# Patient Record
Sex: Female | Born: 1935 | Race: White | Hispanic: No | Smoking: Never smoker
Health system: Southern US, Community
[De-identification: ages and names within clinical notes are randomized; demographics above are authoritative.]

## PROBLEM LIST (undated history)

## (undated) DIAGNOSIS — R42 Dizziness and giddiness: Secondary | ICD-10-CM

## (undated) DIAGNOSIS — M810 Age-related osteoporosis without current pathological fracture: Secondary | ICD-10-CM

## (undated) DIAGNOSIS — K219 Gastro-esophageal reflux disease without esophagitis: Secondary | ICD-10-CM

## (undated) DIAGNOSIS — K5792 Diverticulitis of intestine, part unspecified, without perforation or abscess without bleeding: Secondary | ICD-10-CM

## (undated) DIAGNOSIS — M069 Rheumatoid arthritis, unspecified: Secondary | ICD-10-CM

## (undated) HISTORY — PX: APPENDECTOMY: SHX54

---

## 2015-05-05 ENCOUNTER — Encounter (HOSPITAL_COMMUNITY): Payer: Self-pay | Admitting: *Deleted

## 2015-05-05 ENCOUNTER — Emergency Department (HOSPITAL_COMMUNITY): Payer: Medicare Other

## 2015-05-05 ENCOUNTER — Inpatient Hospital Stay (HOSPITAL_COMMUNITY): Payer: Medicare Other

## 2015-05-05 ENCOUNTER — Inpatient Hospital Stay (HOSPITAL_COMMUNITY): Payer: Medicare Other | Admitting: Anesthesiology

## 2015-05-05 ENCOUNTER — Inpatient Hospital Stay (HOSPITAL_COMMUNITY)
Admission: EM | Admit: 2015-05-05 | Discharge: 2015-05-08 | DRG: 470 | Disposition: A | Discharge status: Skilled Nursing Facility | Payer: Medicare Other | Attending: Internal Medicine | Admitting: Internal Medicine

## 2015-05-05 ENCOUNTER — Encounter (HOSPITAL_COMMUNITY)
Admission: EM | Disposition: A | Discharge status: Skilled Nursing Facility | Payer: Self-pay | Source: Home / Self Care | Attending: Internal Medicine

## 2015-05-05 DIAGNOSIS — D72829 Elevated white blood cell count, unspecified: Secondary | ICD-10-CM | POA: Diagnosis present

## 2015-05-05 DIAGNOSIS — M069 Rheumatoid arthritis, unspecified: Secondary | ICD-10-CM | POA: Diagnosis present

## 2015-05-05 DIAGNOSIS — R509 Fever, unspecified: Secondary | ICD-10-CM | POA: Diagnosis not present

## 2015-05-05 DIAGNOSIS — Z7982 Long term (current) use of aspirin: Secondary | ICD-10-CM | POA: Diagnosis not present

## 2015-05-05 DIAGNOSIS — Y92240 Courthouse as the place of occurrence of the external cause: Secondary | ICD-10-CM

## 2015-05-05 DIAGNOSIS — M25551 Pain in right hip: Secondary | ICD-10-CM | POA: Diagnosis present

## 2015-05-05 DIAGNOSIS — W108XXA Fall (on) (from) other stairs and steps, initial encounter: Secondary | ICD-10-CM | POA: Diagnosis present

## 2015-05-05 DIAGNOSIS — W19XXXA Unspecified fall, initial encounter: Secondary | ICD-10-CM | POA: Insufficient documentation

## 2015-05-05 DIAGNOSIS — K219 Gastro-esophageal reflux disease without esophagitis: Secondary | ICD-10-CM | POA: Diagnosis present

## 2015-05-05 DIAGNOSIS — S0181XA Laceration without foreign body of other part of head, initial encounter: Secondary | ICD-10-CM | POA: Diagnosis not present

## 2015-05-05 DIAGNOSIS — M810 Age-related osteoporosis without current pathological fracture: Secondary | ICD-10-CM | POA: Diagnosis present

## 2015-05-05 DIAGNOSIS — D62 Acute posthemorrhagic anemia: Secondary | ICD-10-CM | POA: Diagnosis not present

## 2015-05-05 DIAGNOSIS — Z01811 Encounter for preprocedural respiratory examination: Secondary | ICD-10-CM

## 2015-05-05 DIAGNOSIS — S52134A Nondisplaced fracture of neck of right radius, initial encounter for closed fracture: Secondary | ICD-10-CM | POA: Diagnosis not present

## 2015-05-05 DIAGNOSIS — S72009A Fracture of unspecified part of neck of unspecified femur, initial encounter for closed fracture: Secondary | ICD-10-CM | POA: Diagnosis present

## 2015-05-05 DIAGNOSIS — D696 Thrombocytopenia, unspecified: Secondary | ICD-10-CM | POA: Diagnosis present

## 2015-05-05 DIAGNOSIS — I959 Hypotension, unspecified: Secondary | ICD-10-CM | POA: Diagnosis not present

## 2015-05-05 DIAGNOSIS — S5011XA Contusion of right forearm, initial encounter: Secondary | ICD-10-CM

## 2015-05-05 DIAGNOSIS — S5010XA Contusion of unspecified forearm, initial encounter: Secondary | ICD-10-CM | POA: Insufficient documentation

## 2015-05-05 DIAGNOSIS — S72001A Fracture of unspecified part of neck of right femur, initial encounter for closed fracture: Principal | ICD-10-CM | POA: Diagnosis present

## 2015-05-05 DIAGNOSIS — Z96641 Presence of right artificial hip joint: Secondary | ICD-10-CM

## 2015-05-05 DIAGNOSIS — T148XXA Other injury of unspecified body region, initial encounter: Secondary | ICD-10-CM

## 2015-05-05 DIAGNOSIS — I9581 Postprocedural hypotension: Secondary | ICD-10-CM | POA: Diagnosis not present

## 2015-05-05 DIAGNOSIS — I950 Idiopathic hypotension: Secondary | ICD-10-CM | POA: Diagnosis not present

## 2015-05-05 HISTORY — DX: Rheumatoid arthritis, unspecified: M06.9

## 2015-05-05 HISTORY — DX: Diverticulitis of intestine, part unspecified, without perforation or abscess without bleeding: K57.92

## 2015-05-05 HISTORY — DX: Gastro-esophageal reflux disease without esophagitis: K21.9

## 2015-05-05 HISTORY — DX: Dizziness and giddiness: R42

## 2015-05-05 HISTORY — DX: Age-related osteoporosis without current pathological fracture: M81.0

## 2015-05-05 LAB — URINALYSIS, ROUTINE W REFLEX MICROSCOPIC
Bilirubin Urine: NEGATIVE
GLUCOSE, UA: NEGATIVE mg/dL
HGB URINE DIPSTICK: NEGATIVE
Ketones, ur: NEGATIVE mg/dL
Nitrite: NEGATIVE
Protein, ur: NEGATIVE mg/dL
SPECIFIC GRAVITY, URINE: 1.008 (ref 1.005–1.030)
UROBILINOGEN UA: 0.2 mg/dL (ref 0.0–1.0)
pH: 7 (ref 5.0–8.0)

## 2015-05-05 LAB — CBC WITH DIFFERENTIAL/PLATELET
BASOS ABS: 0 10*3/uL (ref 0.0–0.1)
BASOS PCT: 1 %
EOS PCT: 1 %
Eosinophils Absolute: 0.1 10*3/uL (ref 0.0–0.7)
HCT: 48.2 % — ABNORMAL HIGH (ref 36.0–46.0)
Hemoglobin: 15.9 g/dL — ABNORMAL HIGH (ref 12.0–15.0)
Lymphocytes Relative: 12 %
Lymphs Abs: 1 10*3/uL (ref 0.7–4.0)
MCH: 31.2 pg (ref 26.0–34.0)
MCHC: 33 g/dL (ref 30.0–36.0)
MCV: 94.7 fL (ref 78.0–100.0)
MONO ABS: 0.7 10*3/uL (ref 0.1–1.0)
MONOS PCT: 8 %
Neutro Abs: 6.5 10*3/uL (ref 1.7–7.7)
Neutrophils Relative %: 78 %
PLATELETS: 258 10*3/uL (ref 150–400)
RBC: 5.09 MIL/uL (ref 3.87–5.11)
RDW: 13 % (ref 11.5–15.5)
WBC: 8.3 10*3/uL (ref 4.0–10.5)

## 2015-05-05 LAB — URINE MICROSCOPIC-ADD ON

## 2015-05-05 LAB — BASIC METABOLIC PANEL
Anion gap: 9 (ref 5–15)
BUN: 19 mg/dL (ref 6–20)
CALCIUM: 9.7 mg/dL (ref 8.9–10.3)
CO2: 27 mmol/L (ref 22–32)
CREATININE: 0.88 mg/dL (ref 0.44–1.00)
Chloride: 103 mmol/L (ref 101–111)
GFR calc Af Amer: >60 mL/min (ref >60)
GFR, EST NON AFRICAN AMERICAN: 60 mL/min — AB (ref >60)
GLUCOSE: 101 mg/dL — AB (ref 65–99)
Potassium: 4.6 mmol/L (ref 3.5–5.1)
Sodium: 139 mmol/L (ref 135–145)

## 2015-05-05 LAB — SURGICAL PCR SCREEN
MRSA, PCR: NEGATIVE
STAPHYLOCOCCUS AUREUS: NEGATIVE

## 2015-05-05 LAB — CBG MONITORING, ED: Glucose-Capillary: 111 mg/dL — ABNORMAL HIGH (ref 65–99)

## 2015-05-05 SURGERY — HEMIARTHROPLASTY, HIP, DIRECT ANTERIOR APPROACH, FOR FRACTURE
Anesthesia: General | Site: Hip | Laterality: Right

## 2015-05-05 MED ORDER — SODIUM CHLORIDE 0.9 % IV BOLUS (SEPSIS)
500.0000 mL | Freq: Once | INTRAVENOUS | Status: AC
  Administered 2015-05-05: 500 mL via INTRAVENOUS

## 2015-05-05 MED ORDER — METHOCARBAMOL 500 MG PO TABS
500.0000 mg | ORAL_TABLET | Freq: Four times a day (QID) | ORAL | Status: DC | PRN
  Administered 2015-05-07 – 2015-05-08 (×3): 500 mg via ORAL
  Filled 2015-05-05 (×3): qty 1

## 2015-05-05 MED ORDER — HYDROMORPHONE HCL 1 MG/ML IJ SOLN
INTRAMUSCULAR | Status: AC
  Filled 2015-05-05: qty 1

## 2015-05-05 MED ORDER — MORPHINE SULFATE (PF) 4 MG/ML IV SOLN
4.0000 mg | Freq: Once | INTRAVENOUS | Status: AC
  Administered 2015-05-05: 4 mg via INTRAVENOUS
  Filled 2015-05-05: qty 1

## 2015-05-05 MED ORDER — CEFAZOLIN SODIUM-DEXTROSE 2-3 GM-% IV SOLR
INTRAVENOUS | Status: AC
  Filled 2015-05-05: qty 50

## 2015-05-05 MED ORDER — PHENYLEPHRINE HCL 10 MG/ML IJ SOLN
10.0000 mg | INTRAVENOUS | Status: DC | PRN
  Administered 2015-05-05: 40 ug/min via INTRAVENOUS

## 2015-05-05 MED ORDER — FENTANYL CITRATE (PF) 250 MCG/5ML IJ SOLN
INTRAMUSCULAR | Status: AC
  Filled 2015-05-05: qty 25

## 2015-05-05 MED ORDER — ENOXAPARIN SODIUM 40 MG/0.4ML ~~LOC~~ SOLN
40.0000 mg | SUBCUTANEOUS | Status: DC
  Administered 2015-05-06: 40 mg via SUBCUTANEOUS
  Filled 2015-05-05 (×3): qty 0.4

## 2015-05-05 MED ORDER — PROPOFOL 10 MG/ML IV BOLUS
INTRAVENOUS | Status: DC | PRN
  Administered 2015-05-05: 80 mg via INTRAVENOUS

## 2015-05-05 MED ORDER — PROMETHAZINE HCL 25 MG/ML IJ SOLN: 6.2500 mg | INTRAMUSCULAR | Status: DC | PRN

## 2015-05-05 MED ORDER — DEXAMETHASONE SODIUM PHOSPHATE 10 MG/ML IJ SOLN
INTRAMUSCULAR | Status: AC
  Filled 2015-05-05: qty 1

## 2015-05-05 MED ORDER — HYDROMORPHONE HCL 1 MG/ML IJ SOLN
0.2500 mg | INTRAMUSCULAR | Status: DC | PRN
  Administered 2015-05-05 (×2): 0.25 mg via INTRAVENOUS

## 2015-05-05 MED ORDER — MENTHOL 3 MG MT LOZG: 1.0000 | LOZENGE | OROMUCOSAL | Status: DC | PRN

## 2015-05-05 MED ORDER — LACTATED RINGERS IV SOLN
INTRAVENOUS | Status: DC | PRN
Start: 2015-05-05 — End: 2015-05-05
  Administered 2015-05-05: 20:00:00 via INTRAVENOUS

## 2015-05-05 MED ORDER — METOCLOPRAMIDE HCL 10 MG PO TABS
5.0000 mg | ORAL_TABLET | Freq: Three times a day (TID) | ORAL | Status: DC | PRN
  Administered 2015-05-06: 10 mg via ORAL
  Filled 2015-05-05: qty 1

## 2015-05-05 MED ORDER — DEXTROSE 5 % IV SOLN
500.0000 mg | Freq: Four times a day (QID) | INTRAVENOUS | Status: DC | PRN
  Administered 2015-05-05 – 2015-05-06 (×2): 500 mg via INTRAVENOUS
  Filled 2015-05-05 (×6): qty 5

## 2015-05-05 MED ORDER — LIDOCAINE-EPINEPHRINE-TETRACAINE (LET) SOLUTION
3.0000 mL | Freq: Once | NASAL | Status: AC
  Administered 2015-05-05: 3 mL via TOPICAL
  Filled 2015-05-05: qty 3

## 2015-05-05 MED ORDER — ONDANSETRON HCL 4 MG/2ML IJ SOLN
INTRAMUSCULAR | Status: DC | PRN
  Administered 2015-05-05 (×2): 2 mg via INTRAVENOUS

## 2015-05-05 MED ORDER — ONDANSETRON HCL 4 MG/2ML IJ SOLN: 4.0000 mg | Freq: Four times a day (QID) | INTRAMUSCULAR | Status: DC | PRN

## 2015-05-05 MED ORDER — OXYCODONE HCL 5 MG PO TABS: 5.0000 mg | ORAL_TABLET | ORAL | Status: DC | PRN

## 2015-05-05 MED ORDER — MORPHINE SULFATE (PF) 2 MG/ML IV SOLN
0.5000 mg | INTRAVENOUS | Status: DC | PRN
  Administered 2015-05-05: 0.5 mg via INTRAVENOUS
  Filled 2015-05-05: qty 1

## 2015-05-05 MED ORDER — LIDOCAINE HCL (CARDIAC) 20 MG/ML IV SOLN
INTRAVENOUS | Status: DC | PRN
  Administered 2015-05-05: 50 mg via INTRAVENOUS

## 2015-05-05 MED ORDER — CEFAZOLIN SODIUM-DEXTROSE 2-3 GM-% IV SOLR
2.0000 g | Freq: Four times a day (QID) | INTRAVENOUS | Status: AC
  Administered 2015-05-06 (×2): 2 g via INTRAVENOUS
  Filled 2015-05-05 (×2): qty 50

## 2015-05-05 MED ORDER — METOCLOPRAMIDE HCL 5 MG/ML IJ SOLN
5.0000 mg | Freq: Three times a day (TID) | INTRAMUSCULAR | Status: DC | PRN
  Administered 2015-05-05: 5 mg via INTRAVENOUS
  Filled 2015-05-05: qty 2

## 2015-05-05 MED ORDER — ONDANSETRON HCL 4 MG PO TABS: 4.0000 mg | ORAL_TABLET | Freq: Four times a day (QID) | ORAL | Status: DC | PRN

## 2015-05-05 MED ORDER — PROPOFOL 10 MG/ML IV BOLUS
INTRAVENOUS | Status: AC
  Filled 2015-05-05: qty 20

## 2015-05-05 MED ORDER — ASPIRIN EC 325 MG PO TBEC
325.0000 mg | DELAYED_RELEASE_TABLET | Freq: Two times a day (BID) | ORAL | Status: DC
  Administered 2015-05-06 – 2015-05-08 (×5): 325 mg via ORAL
  Filled 2015-05-05 (×7): qty 1

## 2015-05-05 MED ORDER — HYDROCODONE-ACETAMINOPHEN 5-325 MG PO TABS: 1.0000 | ORAL_TABLET | Freq: Four times a day (QID) | ORAL | Status: DC | PRN

## 2015-05-05 MED ORDER — LACTATED RINGERS IV SOLN: INTRAVENOUS | Status: DC

## 2015-05-05 MED ORDER — SUCCINYLCHOLINE CHLORIDE 20 MG/ML IJ SOLN
INTRAMUSCULAR | Status: DC | PRN
  Administered 2015-05-05: 80 mg via INTRAVENOUS

## 2015-05-05 MED ORDER — ONDANSETRON HCL 4 MG/2ML IJ SOLN
INTRAMUSCULAR | Status: AC
  Filled 2015-05-05: qty 2

## 2015-05-05 MED ORDER — ONDANSETRON HCL 4 MG/2ML IJ SOLN
4.0000 mg | Freq: Once | INTRAMUSCULAR | Status: AC
  Administered 2015-05-05: 4 mg via INTRAVENOUS
  Filled 2015-05-05: qty 2

## 2015-05-05 MED ORDER — LACTATED RINGERS IV SOLN
INTRAVENOUS | Status: DC | PRN
  Administered 2015-05-05 (×3): via INTRAVENOUS

## 2015-05-05 MED ORDER — ONDANSETRON HCL 4 MG/2ML IJ SOLN
4.0000 mg | Freq: Four times a day (QID) | INTRAMUSCULAR | Status: DC | PRN
  Administered 2015-05-06 (×2): 4 mg via INTRAVENOUS
  Filled 2015-05-05 (×2): qty 2

## 2015-05-05 MED ORDER — DEXAMETHASONE SODIUM PHOSPHATE 10 MG/ML IJ SOLN
INTRAMUSCULAR | Status: DC | PRN
  Administered 2015-05-05: 10 mg via INTRAVENOUS

## 2015-05-05 MED ORDER — PHENOL 1.4 % MT LIQD: 1.0000 | OROMUCOSAL | Status: DC | PRN

## 2015-05-05 MED ORDER — MORPHINE SULFATE (PF) 2 MG/ML IV SOLN: 0.5000 mg | INTRAVENOUS | Status: DC | PRN

## 2015-05-05 MED ORDER — ACETAMINOPHEN 650 MG RE SUPP: 650.0000 mg | Freq: Four times a day (QID) | RECTAL | Status: DC | PRN

## 2015-05-05 MED ORDER — PANTOPRAZOLE SODIUM 40 MG IV SOLR
40.0000 mg | INTRAVENOUS | Status: DC
  Administered 2015-05-06: 40 mg via INTRAVENOUS
  Filled 2015-05-05 (×4): qty 40

## 2015-05-05 MED ORDER — SODIUM CHLORIDE 0.9 % IR SOLN
Status: DC | PRN
  Administered 2015-05-05: 1000 mL

## 2015-05-05 MED ORDER — PANTOPRAZOLE SODIUM 40 MG IV SOLR
40.0000 mg | Freq: Once | INTRAVENOUS | Status: AC
  Administered 2015-05-05: 40 mg via INTRAVENOUS
  Filled 2015-05-05: qty 40

## 2015-05-05 MED ORDER — FENTANYL CITRATE (PF) 100 MCG/2ML IJ SOLN
INTRAMUSCULAR | Status: DC | PRN
  Administered 2015-05-05: 100 ug via INTRAVENOUS
  Administered 2015-05-05: 50 ug via INTRAVENOUS

## 2015-05-05 MED ORDER — DOCUSATE SODIUM 100 MG PO CAPS
100.0000 mg | ORAL_CAPSULE | Freq: Two times a day (BID) | ORAL | Status: DC
  Administered 2015-05-06 – 2015-05-08 (×5): 100 mg via ORAL

## 2015-05-05 MED ORDER — HYDROCODONE-ACETAMINOPHEN 5-325 MG PO TABS
1.0000 | ORAL_TABLET | Freq: Four times a day (QID) | ORAL | Status: DC | PRN
  Filled 2015-05-05: qty 1

## 2015-05-05 MED ORDER — SODIUM CHLORIDE 0.9 % IV SOLN
INTRAVENOUS | Status: DC
  Administered 2015-05-05 – 2015-05-06 (×3): via INTRAVENOUS

## 2015-05-05 MED ORDER — CEFAZOLIN SODIUM-DEXTROSE 2-3 GM-% IV SOLR
INTRAVENOUS | Status: DC | PRN
  Administered 2015-05-05: 2 g via INTRAVENOUS

## 2015-05-05 MED ORDER — ACETAMINOPHEN 325 MG PO TABS
650.0000 mg | ORAL_TABLET | Freq: Four times a day (QID) | ORAL | Status: DC | PRN
  Administered 2015-05-06 – 2015-05-08 (×4): 650 mg via ORAL
  Filled 2015-05-05 (×4): qty 2

## 2015-05-05 MED ORDER — PHENYLEPHRINE HCL 10 MG/ML IJ SOLN
INTRAMUSCULAR | Status: DC | PRN
  Administered 2015-05-05 (×2): 40 ug via INTRAVENOUS

## 2015-05-05 SURGICAL SUPPLY — 41 items
BAG ZIPLOCK 12X15 (MISCELLANEOUS) IMPLANT
BENZOIN TINCTURE PRP APPL 2/3 (GAUZE/BANDAGES/DRESSINGS) ×3 IMPLANT
BLADE SAW SGTL 18X1.27X75 (BLADE) ×2 IMPLANT
BLADE SAW SGTL 18X1.27X75MM (BLADE) ×1
CAPT HIP TOTAL 2 ×3 IMPLANT
CELLS DAT CNTRL 66122 CELL SVR (MISCELLANEOUS) ×1 IMPLANT
CLOSURE WOUND 1/2 X4 (GAUZE/BANDAGES/DRESSINGS) ×1
COVER PERINEAL POST (MISCELLANEOUS) ×3 IMPLANT
DRAPE C-ARM 42X120 X-RAY (DRAPES) ×3 IMPLANT
DRAPE STERI IOBAN 125X83 (DRAPES) ×3 IMPLANT
DRAPE U-SHAPE 47X51 STRL (DRAPES) ×9 IMPLANT
DRSG AQUACEL AG ADV 3.5X10 (GAUZE/BANDAGES/DRESSINGS) ×3 IMPLANT
DURAPREP 26ML APPLICATOR (WOUND CARE) ×3 IMPLANT
ELECT BLADE TIP CTD 4 INCH (ELECTRODE) ×3 IMPLANT
ELECT REM PT RETURN 9FT ADLT (ELECTROSURGICAL) ×3
ELECTRODE REM PT RTRN 9FT ADLT (ELECTROSURGICAL) ×1 IMPLANT
FACESHIELD WRAPAROUND (MASK) ×12 IMPLANT
GAUZE XEROFORM 1X8 LF (GAUZE/BANDAGES/DRESSINGS) IMPLANT
GLOVE BIO SURGEON STRL SZ7.5 (GLOVE) ×3 IMPLANT
GLOVE BIOGEL PI IND STRL 8 (GLOVE) ×2 IMPLANT
GLOVE BIOGEL PI INDICATOR 8 (GLOVE) ×4
GLOVE ECLIPSE 8.0 STRL XLNG CF (GLOVE) ×3 IMPLANT
GOWN STRL REUS W/TWL XL LVL3 (GOWN DISPOSABLE) ×6 IMPLANT
HANDPIECE INTERPULSE COAX TIP (DISPOSABLE) ×2
KIT BASIN OR (CUSTOM PROCEDURE TRAY) ×3 IMPLANT
PACK TOTAL JOINT (CUSTOM PROCEDURE TRAY) ×3 IMPLANT
PEN SKIN MARKING BROAD (MISCELLANEOUS) ×3 IMPLANT
RTRCTR WOUND ALEXIS 18CM MED (MISCELLANEOUS) ×3
SET HNDPC FAN SPRY TIP SCT (DISPOSABLE) ×1 IMPLANT
STAPLER VISISTAT 35W (STAPLE) IMPLANT
STRIP CLOSURE SKIN 1/2X4 (GAUZE/BANDAGES/DRESSINGS) ×2 IMPLANT
SUT ETHIBOND NAB CT1 #1 30IN (SUTURE) ×3 IMPLANT
SUT MNCRL AB 4-0 PS2 18 (SUTURE) IMPLANT
SUT VIC AB 0 CT1 36 (SUTURE) ×3 IMPLANT
SUT VIC AB 1 CT1 36 (SUTURE) ×3 IMPLANT
SUT VIC AB 2-0 CT1 27 (SUTURE) ×4
SUT VIC AB 2-0 CT1 TAPERPNT 27 (SUTURE) ×2 IMPLANT
TOWEL OR 17X26 10 PK STRL BLUE (TOWEL DISPOSABLE) ×3 IMPLANT
TOWEL OR NON WOVEN STRL DISP B (DISPOSABLE) ×3 IMPLANT
TRAY FOLEY W/METER SILVER 14FR (SET/KITS/TRAYS/PACK) ×3 IMPLANT
YANKAUER SUCT BULB TIP 10FT TU (MISCELLANEOUS) ×3 IMPLANT

## 2015-05-05 NOTE — ED Notes (Signed)
Pt back from xray, will apply LET

## 2015-05-05 NOTE — ED Notes (Signed)
Report given to Wylene Men, RN, ED RN unable to do bedside reporting at this time.

## 2015-05-05 NOTE — Anesthesia Preprocedure Evaluation (Addendum)
Anesthesia Evaluation  Patient identified by MRN, date of birth, ID band Patient awake    Reviewed: Allergy & Precautions, H&P , NPO status , Patient's Chart, lab work & pertinent test results  Airway Mallampati: II  TM Distance: >3 FB Neck ROM: full    Dental no notable dental hx. (+) Dental Advisory Given, Teeth Intact   Pulmonary neg pulmonary ROS,    Pulmonary exam normal breath sounds clear to auscultation       Cardiovascular Exercise Tolerance: Good negative cardio ROS Normal cardiovascular exam Rhythm:regular Rate:Normal     Neuro/Psych vertigo negative neurological ROS  negative psych ROS   GI/Hepatic negative GI ROS, Neg liver ROS,   Endo/Other  negative endocrine ROS  Renal/GU negative Renal ROS  negative genitourinary   Musculoskeletal  (+) Arthritis , Rheumatoid disorders,    Abdominal   Peds  Hematology negative hematology ROS (+)   Anesthesia Other Findings   Reproductive/Obstetrics negative OB ROS                            Anesthesia Physical Anesthesia Plan  ASA: III  Anesthesia Plan: General   Post-op Pain Management:    Induction: Intravenous  Airway Management Planned: Oral ETT  Additional Equipment:   Intra-op Plan:   Post-operative Plan: Extubation in OR  Informed Consent: I have reviewed the patients History and Physical, chart, labs and discussed the procedure including the risks, benefits and alternatives for the proposed anesthesia with the patient or authorized representative who has indicated his/her understanding and acceptance.   Dental Advisory Given  Plan Discussed with: CRNA and Surgeon  Anesthesia Plan Comments:         Anesthesia Quick Evaluation

## 2015-05-05 NOTE — Transfer of Care (Signed)
Immediate Anesthesia Transfer of Care Note  Patient: Stacey Hardin  Procedure(s) Performed: Procedure(s): RIGHT ANTERIOR APPROACH HIP ARTHROPLASTY (Right)  Patient Location: PACU  Anesthesia Type:General  Level of Consciousness: awake, pateint uncooperative, confused, lethargic and responds to stimulation  Airway & Oxygen Therapy: Patient Spontanous Breathing and Patient connected to face mask oxygen  Post-op Assessment: Report given to RN, Post -op Vital signs reviewed and stable and Patient moving all extremities  Post vital signs: Reviewed and stable  Last Vitals:  Filed Vitals:   05/05/15 1507  BP: 113/60  Pulse: 76  Temp: 36.2 C  Resp: 17    Complications: No apparent anesthesia complications

## 2015-05-05 NOTE — ED Notes (Signed)
Bed: DZ32 Expected date:  Expected time:  Means of arrival:  Comments: EMS- 81yo F, fall/elbow pain/leg pain/headache/no thinners

## 2015-05-05 NOTE — Anesthesia Procedure Notes (Signed)
Procedure Name: Intubation Date/Time: 05/05/2015 6:18 PM Performed by: Paris Lore Pre-anesthesia Checklist: Patient identified, Emergency Drugs available, Suction available, Patient being monitored and Timeout performed Patient Re-evaluated:Patient Re-evaluated prior to inductionOxygen Delivery Method: Circle system utilized Preoxygenation: Pre-oxygenation with 100% oxygen Intubation Type: IV induction Ventilation: Mask ventilation without difficulty Laryngoscope Size: Mac and 4 Grade View: Grade I Tube type: Oral Tube size: 7.5 mm Number of attempts: 1 Airway Equipment and Method: Stylet Placement Confirmation: ETT inserted through vocal cords under direct vision,  positive ETCO2,  CO2 detector and breath sounds checked- equal and bilateral Secured at: 19 cm Tube secured with: Tape Dental Injury: Teeth and Oropharynx as per pre-operative assessment

## 2015-05-05 NOTE — ED Notes (Signed)
Pt occasionally holds her breath and oxygen sat drops to 88%, 2 L [place] applied.

## 2015-05-05 NOTE — ED Notes (Signed)
Pt needs laceration closure before pt can be transferred upstairs.

## 2015-05-05 NOTE — ED Notes (Signed)
md at bedside  Pt alert and oriented x4. Respirations even and unlabored, bilateral symmetrical rise and fall of chest. Skin warm and dry. In no acute distress. Denies needs.   

## 2015-05-05 NOTE — Progress Notes (Signed)
Patient ID: Stacey Hardin, female   DOB: 06-21-1932, 81 y.o.   MRN: [id number] I was called to see Stacey Hardin as a consult to address her right hip femoral neck fracture.  I plan on proceeding to surgery sometime this evening - likely after 6 pm given the OR schedule and already fixing another hip this evening.  Should stay NPO for now since surgery will be later tonight.  Will see the patient this evening to discuss surgery in further detail and go over x-rays.  She will also be seen by the Medical Service for assessment and evaluation for surgical clearance.

## 2015-05-05 NOTE — Consult Note (Signed)
Reason for Consult:  Right hip fracture and right radial neck fracture Referring Physician: EDP  Stacey Hardin is an 81 y.o. female.  HPI: 81 yo female who [place] irregular surface.  Had significant right hip pain and right elbow pain and was brought to the [facility] ED where she was found to have a right hip fracture and a right radial neck fracture.  Ortho is consulted to address these injuries.  She reports significant right hip and right elbow pain.  Past Medical History  Diagnosis Date  . Osteoporosis   . Vertigo   . Diverticulitis   . Rheumatoid arthritis (HCC)   . GERD (gastroesophageal reflux disease)     Past Surgical History  Procedure Laterality Date  . Appendectomy      History reviewed. No pertinent family history.  Social History:  reports that she has never smoked. She does not have any smokeless tobacco history on file. She reports that she does not drink alcohol or use illicit drugs.  Allergies: No Known Allergies  Medications: I have reviewed the patient's current medications.  Results for orders placed or performed during the hospital encounter of 05/05/15 (from the past 48 hour(s))  Basic metabolic panel     Status: Abnormal   Collection Time: 05/05/15 10:19 AM  Result Value Ref Range   Sodium 139 135 - 145 mmol/L   Potassium 4.6 3.5 - 5.1 mmol/L   Chloride 103 101 - 111 mmol/L   CO2 27 22 - 32 mmol/L   Glucose, Bld 101 (H) 65 - 99 mg/dL   BUN 19 6 - 20 mg/dL   Creatinine, Ser 0.88 0.44 - 1.00 mg/dL   Calcium 9.7 8.9 - 10.3 mg/dL   GFR calc non Af Amer 60 (L) >60 mL/min   GFR calc Af Amer >60 >60 mL/min    Comment: (NOTE) The eGFR has been calculated using the CKD EPI equation. This calculation has not been validated in all clinical situations. eGFR's persistently <60 mL/min signify possible Chronic Kidney Disease.    Anion gap 9 5 - 15  CBC with Differential      Status: Abnormal   Collection Time: 05/05/15 10:19 AM  Result Value Ref Range   WBC 8.3 4.0 - 10.5 K/uL   RBC 5.09 3.87 - 5.11 MIL/uL   Hemoglobin 15.9 (H) 12.0 - 15.0 g/dL   HCT 48.2 (H) 36.0 - 46.0 %   MCV 94.7 78.0 - 100.0 fL   MCH 31.2 26.0 - 34.0 pg   MCHC 33.0 30.0 - 36.0 g/dL   RDW 13.0 11.5 - 15.5 %   Platelets 258 150 - 400 K/uL   Neutrophils Relative % 78 %   Neutro Abs 6.5 1.7 - 7.7 K/uL   Lymphocytes Relative 12 %   Lymphs Abs 1.0 0.7 - 4.0 K/uL   Monocytes Relative 8 %   Monocytes Absolute 0.7 0.1 - 1.0 K/uL   Eosinophils Relative 1 %   Eosinophils Absolute 0.1 0.0 - 0.7 K/uL   Basophils Relative 1 %   Basophils Absolute 0.0 0.0 - 0.1 K/uL  CBG monitoring, ED     Status: Abnormal   Collection Time: 05/05/15 12:28 PM  Result Value Ref Range   Glucose-Capillary 111 (H) 65 - 99 mg/dL  Urinalysis, Routine w reflex microscopic (not at [facility])     Status: Abnormal   Collection Time: 05/05/15  1:25 PM  Result Value   Ref Range   Color, Urine YELLOW YELLOW   APPearance CLEAR CLEAR   Specific Gravity, Urine 1.008 1.005 - 1.030   pH 7.0 5.0 - 8.0   Glucose, UA NEGATIVE NEGATIVE mg/dL   Hgb urine dipstick NEGATIVE NEGATIVE   Bilirubin Urine NEGATIVE NEGATIVE   Ketones, ur NEGATIVE NEGATIVE mg/dL   Protein, ur NEGATIVE NEGATIVE mg/dL   Urobilinogen, UA 0.2 0.0 - 1.0 mg/dL   Nitrite NEGATIVE NEGATIVE   Leukocytes, UA SMALL (A) NEGATIVE  Urine microscopic-add on     Status: None   Collection Time: 05/05/15  1:25 PM  Result Value Ref Range   Squamous Epithelial / LPF RARE RARE   WBC, UA 0-2 <3 WBC/hpf   RBC / HPF 0-2 <3 RBC/hpf    Dg Chest 1 View  05/05/2015  CLINICAL DATA:  Right hip fracture preop evaluation EXAM: CHEST 1 VIEW COMPARISON:  None. FINDINGS: Heart size is normal. Vascularity is normal. Negative for infiltrate or effusion. Mild apical pleural thickening bilaterally. Small calcified granuloma right lung base. No pleural effusion. IMPRESSION: No active  disease. Electronically Signed   By: Franchot Gallo M.D.   On: 05/05/2015 11:05   Dg Forearm Right  05/05/2015  CLINICAL DATA:  Fall, injured right forearm today. EXAM: RIGHT FOREARM - 2 VIEW COMPARISON:  None. FINDINGS: There is a nondisplaced fracture through the right radial head and neck. No additional acute bony abnormality. No subluxation or dislocation. Soft tissues are intact. IMPRESSION: Nondisplaced right radial head and neck fracture. Electronically Signed   By: Rolm Baptise M.D.   On: 05/05/2015 13:29   Ct Head Wo Contrast  05/05/2015  CLINICAL DATA:  pt outside walking, pt stumbled, did not trip over anything, pt thinks she just lost her balance and shoes scuffed. Pt fell landing on right elbow. All impact on right side. Pt did hit head, no LOC, no neuro deficits. EXAM: CT HEAD WITHOUT CONTRAST TECHNIQUE: Contiguous axial images were obtained from the base of the skull through the vertex without intravenous contrast. COMPARISON:  None. FINDINGS: Small right frontal supraorbital hematoma.Diffuse parenchymal atrophy. Patchy areas of hypoattenuation in deep and periventricular white matter bilaterally. Negative for acute intracranial hemorrhage, mass lesion, acute infarction, midline shift, or mass-effect. Acute infarct may be inapparent on noncontrast CT. Ventricles and sulci symmetric. Bone windows demonstrate no focal lesion. IMPRESSION: 1. Negative for bleed or other acute intracranial process. 2. Atrophy and nonspecific white matter changes. Electronically Signed   By: Lucrezia Europe M.D.   On: 05/05/2015 11:08   Dg Hip Unilat With Pelvis 2-3 Views Right  05/05/2015  CLINICAL DATA:  Fall.  Initial evaluation. EXAM: DG HIP (WITH OR WITHOUT PELVIS) 2-3V RIGHT COMPARISON:  None. FINDINGS: Degenerative changes lumbar spine and both hips. An impacted fracture of the right femoral neck cannot be excluded. No evidence of dislocation. Calcified pelvic densities consistent phleboliths. IMPRESSION:  Impacted fracture of right femoral neck cannot be excluded. Electronically Signed   By: Marcello Moores  Register   On: 05/05/2015 11:03   Dg Femur 1v Right  05/05/2015  CLINICAL DATA:  Status post fall while walking up stairs today now with limited range of motion in the hip and femur EXAM: RIGHT FEMUR 1 VIEW COMPARISON:  Right hip series of today's date FINDINGS: There is an impacted fracture of the humeral head and proximal neck. The femoral head appears appropriately positioned with respect to the acetabulum. The intertrochanteric and subtrochanteric regions of the femur are unremarkable. The shaft and femoral condyles  are intact where visualized. There are degenerative changes of the right knee. IMPRESSION: There is an acute impacted fracture of the junction of the right humeral head and neck. More distally no acute abnormality of the femur is demonstrated. Electronically Signed   By: David  Martinique M.D.   On: 05/05/2015 11:09    ROS Blood pressure 113/60, pulse 76, temperature 97.1 F (36.2 C), temperature source Oral, resp. rate 17, SpO2 95 %. Physical Exam  Constitutional: She is oriented to person, place, and time. She appears well-developed and well-nourished.  HENT:  Head: Normocephalic.  Eyes: EOM are normal. Pupils are equal, round, and reactive to light.  Neck: Normal range of motion. Neck supple.  Cardiovascular: Normal rate and regular rhythm.   Respiratory: Effort normal and breath sounds normal.  GI: Soft. Bowel sounds are normal.  Musculoskeletal:       Right elbow: She exhibits decreased range of motion, swelling and effusion. Tenderness found. Radial head tenderness noted.       Right hip: She exhibits decreased range of motion, decreased strength, tenderness and bony tenderness.  Neurological: She is alert and oriented to person, place, and time.  Skin: Skin is warm and dry.  Psychiatric: She has a normal mood and affect.    Assessment/Plan: Right hip with displaced femoral  neck fracture and non-displaced right radial neck fracture 1)  I have spoken to the patient and her daughter and they understand fully her injuries as well as the recommendation for surgery on her right hip.  She is someone who is very active and and no previous hip issues as well as does not use an assistive device.  I feel a direct anterior hip replacement to treat  This injury is appropriate.  Her right radial neck fracture can be treated with a splint.  The risks and benefits of surgery have been discussed in detail and informed consent is obtained.  Stacey Hardin Y 05/05/2015, 5:32 PM

## 2015-05-05 NOTE — ED Notes (Addendum)
Per ems pt lives with daughter, [place], pt outside walking, pt stumbled, did not trip over anything, pt thinks she just lost her balance and shoes scuffed. Pt fell landing on right elbow. All impact on right side. Pt did hit head, no LOC, no neuro deficits. SCCA passed. Estimated 1 inch laceration to above right eye, bleeding controlled. Elbow pain increases with ROM. Also complaining of right lateral thigh pain. No shortening or rotation of right leg noted. PMS intact x4.   Pt reports hx of vertigo, but denies this was cause of fall.

## 2015-05-05 NOTE — ED Provider Notes (Signed)
CSN: [id number]     Arrival date & time 05/05/15  4034 History   First MD Initiated Contact with Patient 05/05/15 [phone]     Chief Complaint  Patient presents with  . Fall  . Right Arm and Leg Pain      (Consider location/radiation/quality/duration/timing/severity/associated sxs/prior Treatment) HPI Patient was with her [place] the courthouse. They had climbed many steps. At the top of the steps or was a bit of the DIP and the patient lost her balance falling forward and onto her right side. She didn't strike her head and has a laceration to the forehead. She denies that she had loss of consciousness. She reports mild headache. She denies any neck pain, weakness numbness or tingling. She reports she has pain in her right arm. It goes from her mid humerus to her lower forearm. She is able to move it spontaneously without limitation. She denies chest pain or difficulty breathing. She denies abdominal pain. She reports her worst pain is in her right hip and upper thigh. She states it hurts with movement and it was prevented her from being able to get up and walk.  Baseline medical problems are rheumatoid arthritis, osteoarthritis Past Medical History  Diagnosis Date  . Osteoporosis   . Vertigo   . Diverticulitis   . Rheumatoid arthritis (HCC)   . GERD (gastroesophageal reflux disease)    Past Surgical History  Procedure Laterality Date  . Appendectomy     History reviewed. No pertinent family history. Social History  Substance Use Topics  . Smoking status: Never Smoker   . Smokeless tobacco: None  . Alcohol Use: No   OB History    No data available     Review of Systems 10 Systems reviewed and are negative for acute change except as noted in the HPI.    Allergies  Review of patient's allergies indicates no known allergies.  Home Medications   Prior to Admission medications   Medication Sig Start Date End Date Taking? Authorizing Provider  CALCIUM PO Take 1 tablet by  mouth daily.   Yes Historical Provider, MD  Multiple Vitamin (MULTIVITAMIN WITH MINERALS) TABS tablet Take 1 tablet by mouth daily.   Yes Historical Provider, MD   BP 122/57 mmHg  Pulse 81  Temp(Src) 97.9 F (36.6 C) (Oral)  Resp 17  SpO2 100% Physical Exam  Constitutional: She is oriented to person, place, and time. She appears well-developed and well-nourished.  HENT:  Right Ear: External ear normal.  Left Ear: External ear normal.  Nose: Nose normal.  Mouth/Throat: Oropharynx is clear and moist.  Linear 2 cm vertically oriented laceration to the right brow. No active bleeding. No hematoma. Head otherwise normal.  Eyes: EOM are normal. Pupils are equal, round, and reactive to light.  Neck: Neck supple.  No C-spine tenderness to palpation.  Cardiovascular: Normal rate, regular rhythm, normal heart sounds and intact distal pulses.   Pulmonary/Chest: Effort normal and breath sounds normal.  No chest wall tenderness to compression. No abrasions or crepitus.  Abdominal: Soft. Bowel sounds are normal. She exhibits no distension. There is no tenderness.  Musculoskeletal: She exhibits tenderness. She exhibits no edema.  Normal range of motion of the right upper extremity. No areas of apparent contusion or abrasion at this point time no joint effusions. Patient can perform full range of motion. She endorses diffuse tenderness to palpation of the soft tissues of the upper arm and the lower arm. The extremity is neurovascularly intact. Patient reports to  much pain to perform range of motion at the right hip. No obvious contusion and abrasion. Knee is without effusion patient is able to spontaneously move at the knee. Ankle without deformity or effusion.  Neurological: She is alert and oriented to person, place, and time. She has normal strength. No cranial nerve deficit. She exhibits normal muscle tone. Coordination normal. GCS eye subscore is 4. GCS verbal subscore is 5. GCS motor subscore is 6.   Skin: Skin is warm, dry and intact.  Psychiatric: She has a normal mood and affect.    ED Course  .Marland KitchenLaceration Repair Date/Time: 05/05/2015 2:23 PM Performed by: Arby Barrette Authorized by: Arby Barrette Body area: head/neck Location details: forehead Laceration length: 2 cm Foreign bodies: no foreign bodies Tendon involvement: none Nerve involvement: none Vascular damage: yes Anesthesia: see MAR for details Anesthetic total: 3 ml Irrigation solution: saline Amount of cleaning: standard Debridement: none Degree of undermining: none Skin closure: glue Approximation: close Approximation difficulty: simple Patient tolerance: Patient tolerated the procedure well with no immediate complications Comments: Left apply for good hemostasis and to facilitate cleaning. Simple wound with approximately 2 mm of depth closed with tissue glue. No bleeding.   (including critical care time)  Labs Review Labs Reviewed  BASIC METABOLIC PANEL - Abnormal; Notable for the following:    Glucose, Bld 101 (*)    GFR calc non Af Amer 60 (*)    All other components within normal limits  CBC WITH DIFFERENTIAL/PLATELET - Abnormal; Notable for the following:    Hemoglobin 15.9 (*)    HCT 48.2 (*)    All other components within normal limits  CBG MONITORING, ED - Abnormal; Notable for the following:    Glucose-Capillary 111 (*)    All other components within normal limits  URINALYSIS, ROUTINE W REFLEX MICROSCOPIC (NOT AT [facility])    Imaging Review Dg Chest 1 View  05/05/2015  CLINICAL DATA:  Right hip fracture preop evaluation EXAM: CHEST 1 VIEW COMPARISON:  None. FINDINGS: Heart size is normal. Vascularity is normal. Negative for infiltrate or effusion. Mild apical pleural thickening bilaterally. Small calcified granuloma right lung base. No pleural effusion. IMPRESSION: No active disease. Electronically Signed   By: Marlan Palau M.D.   On: 05/05/2015 11:05   Dg Forearm Right  05/05/2015   CLINICAL DATA:  Fall, injured right forearm today. EXAM: RIGHT FOREARM - 2 VIEW COMPARISON:  None. FINDINGS: There is a nondisplaced fracture through the right radial head and neck. No additional acute bony abnormality. No subluxation or dislocation. Soft tissues are intact. IMPRESSION: Nondisplaced right radial head and neck fracture. Electronically Signed   By: Charlett Nose M.D.   On: 05/05/2015 13:29   Ct Head Wo Contrast  05/05/2015  CLINICAL DATA:  pt outside walking, pt stumbled, did not trip over anything, pt thinks she just lost her balance and shoes scuffed. Pt fell landing on right elbow. All impact on right side. Pt did hit head, no LOC, no neuro deficits. EXAM: CT HEAD WITHOUT CONTRAST TECHNIQUE: Contiguous axial images were obtained from the base of the skull through the vertex without intravenous contrast. COMPARISON:  None. FINDINGS: Small right frontal supraorbital hematoma.Diffuse parenchymal atrophy. Patchy areas of hypoattenuation in deep and periventricular white matter bilaterally. Negative for acute intracranial hemorrhage, mass lesion, acute infarction, midline shift, or mass-effect. Acute infarct may be inapparent on noncontrast CT. Ventricles and sulci symmetric. Bone windows demonstrate no focal lesion. IMPRESSION: 1. Negative for bleed or other acute intracranial process. 2.  Atrophy and nonspecific white matter changes. Electronically Signed   By: Corlis Leak M.D.   On: 05/05/2015 11:08   Dg Hip Unilat With Pelvis 2-3 Views Right  05/05/2015  CLINICAL DATA:  Fall.  Initial evaluation. EXAM: DG HIP (WITH OR WITHOUT PELVIS) 2-3V RIGHT COMPARISON:  None. FINDINGS: Degenerative changes lumbar spine and both hips. An impacted fracture of the right femoral neck cannot be excluded. No evidence of dislocation. Calcified pelvic densities consistent phleboliths. IMPRESSION: Impacted fracture of right femoral neck cannot be excluded. Electronically Signed   By: Maisie Fus  Register   On:  05/05/2015 11:03   Dg Femur 1v Right  05/05/2015  CLINICAL DATA:  Status post fall while walking up stairs today now with limited range of motion in the hip and femur EXAM: RIGHT FEMUR 1 VIEW COMPARISON:  Right hip series of today's date FINDINGS: There is an impacted fracture of the humeral head and proximal neck. The femoral head appears appropriately positioned with respect to the acetabulum. The intertrochanteric and subtrochanteric regions of the femur are unremarkable. The shaft and femoral condyles are intact where visualized. There are degenerative changes of the right knee. IMPRESSION: There is an acute impacted fracture of the junction of the right humeral head and neck. More distally no acute abnormality of the femur is demonstrated. Electronically Signed   By: David  [place] M.D.   On: 05/05/2015 11:09   I have personally reviewed and evaluated these images and lab results as part of my medical decision-making.   EKG Interpretation   Date/Time:  Wednesday May 05 2015 11:12:00 EDT Ventricular Rate:  69 PR Interval:  174 QRS Duration: 103 QT Interval:  422 QTC Calculation: 452 R Axis:   -80 Text Interpretation:  Sinus rhythm Left axis deviation Low voltage,  precordial leads RSR' in V1 or V2, probably normal variant ST elevation,  consider inferior injury no acute ischemic appearance. Confirmed by  Donnald Garre, MD, Lebron Conners [id number]) on 05/05/2015 12:16:53 PM     Consult: Reviewed with Dr. Magnus Ivan. Anticipated surgical repair after 5 PM. Maintain nothing by mouth status. MDM   Final diagnoses:  Hip fracture, right, closed, initial encounter (HCC)  Forehead laceration, initial encounter  Fall, initial encounter  Forearm contusion, right, initial encounter   Patient sustained a mechanical fall. CT does not show intra- cranial bleeding. She did have a forehead laceration simple in nature closed with tissue glue. She also had forearm pain with nondisplaced radial head fracture.  There is no deformity or swelling present on physical exam. There is right femoral neck hip fracture. Patient is alert and appropriate. She has no respiratory distress. There are no neurologic deficits. No evidence of chest injury. Patient does not have chest pain or shortness breath.    Arby Barrette, MD 05/05/15 1430

## 2015-05-05 NOTE — Progress Notes (Addendum)
   05/05/15 0000  CM Assessment  Expected Discharge Plan Home w Home Health Services  In-house Referral Clinical Social Work;PCP / Health Connect  Discharge Planning Services NA  [facility] Choice Home Health;Long Term Acute Care (LTAC)  Choice offered to / list presented to  Patient;Adult Children  Status of Service Completed, signed off  Discharge Disposition Long Term Acute Care ( LTAC)   Pt states she came with her daughter to traffic court for a ticket.  Has a daughter in [place] but prefer d/c back to [place] Pt confirmed pcp as Dr Gweneth Fritter in [place]  EPIC updated Pt undecided on disposition- home or "rehab" CM and SW consults entered in [facility] CM provided lists of medicare certified home health agencies and nursing facilities within [place] code [address] and area code [address] Lists given to daughter to assist with pt choice  National home health agencies include [place], [facility], [place], [facility], [place], [facility], [facility] and [place] [facility] CM reviewed in details medicare guidelines, home health [facility]) (length of stay in home, types of [facility] staff available, coverage, primary caregiver, up to 24 hrs before services may be started) and Private duty nursing (PDN-coverage, length of stay in the home types of staff available). CM reviewed availability of HH SW to assist pcp to get pt to snf (if desired disposition) from the community level.  Discussed pt to be further evaluated by unit therapists (PT/OT) for recommendation of level of care and share this with attending MD and unit CM

## 2015-05-05 NOTE — Brief Op Note (Signed)
05/05/2015  8:17 PM  PATIENT:  Stacey Hardin  81 y.o. female  PRE-OPERATIVE DIAGNOSIS:  fractured right hip  POST-OPERATIVE DIAGNOSIS:  fractured right hip  PROCEDURE:  Procedure(s): RIGHT ANTERIOR APPROACH HIP ARTHROPLASTY (Right)  SURGEON:  Surgeon(s) and Role:    * Kathryne Hitch, MD - Primary    * Scott Rise Paganini, MD - Assisting  ANESTHESIA:   general  EBL:  Total I/O In: 1000 [I.V.:1000] Out: 300 [Blood:300]  BLOOD ADMINISTERED:none  DRAINS: none   LOCAL MEDICATIONS USED:  NONE  SPECIMEN:  No Specimen  DISPOSITION OF SPECIMEN:  N/A  COUNTS:  YES  TOURNIQUET:  * No tourniquets in log *  DICTATION: .Other Dictation: Dictation Number [id number]  PLAN OF CARE: Admit to inpatient   PATIENT DISPOSITION:  PACU - hemodynamically stable.   Delay start of Pharmacological VTE agent (>24hrs) due to surgical blood loss or risk of bleeding: no

## 2015-05-05 NOTE — ED Notes (Signed)
Pt having anxiety, saying GERD has increased, md made aware of this earlier and protonix administered. Pt at this time saying she feels "cold and clammy" will check cbg.  Pt has been NPO since 0600.

## 2015-05-05 NOTE — H&P (Addendum)
[facility] History and Physical  Palmer Shorey URK:[id number] DOB: 1931/12/06 DOA: 05/05/2015  Referring physician: ED physician, dr. Clarice Pole  PCP: Pt does not know the name of PCP   Chief Complaint: fall   HPI:  Pt is 81 yo female with no specific medical history who presented to [facility] ED after an episode of fall [place], she lost her balance while climbing the stairs and fell on her right side. She currently reports pain to be constant and sharp, right hip area, 7/10 in severity, non radiating, worse with movement and with no specific alleviating factors. Pt reports being unable to bear any weight. She denies fevers, chills, chest pain or shortness of breath, no similar events in the past.   In ED, pt noted to be hemodynamically stable, VSS, imaging studies notable for right hip femoral neck fracture. TRH asked to admit for further evaluation and ortho team consulted.   Assessment and Plan: Active Problems: Fall - appears to be mechanical - will need PT eval once post op  - bed rest for now  Right hip femoral neck fracture - will need pre op CXR and 12 lead EKG, orders placed - ortho team plans on doing surgery tonight - appreciate assistance - provide analgesia as needed   DVT prophylaxis - Lovenox SQ  Radiological Exams on Admission:  Ct Head Wo Contrast 05/05/2015  Negative for bleed or other acute intracranial process. 2. Atrophy and nonspecific white matter changes.   Dg Hip Unilat With Pelvis 2-3 Views Right 05/05/2015  Impacted fracture of right femoral neck cannot be excluded.   Dg Femur 1v Right 05/05/2015  There is an acute impacted fracture of the junction of the right humeral head and neck. More distally no acute abnormality of the femur is demonstrated  Code Status: Full Family Communication: Pt at bedside Disposition Plan: Admit for further evaluation    Danie Binder [facility] [phone]   Review of Systems:   Constitutional: Negative for fever, chills and malaise/fatigue. Negative for diaphoresis.  HENT: Negative for hearing loss, ear pain, nosebleeds, congestion, sore throat, neck pain, tinnitus and ear discharge.   Eyes: Negative for blurred vision, double vision, photophobia, pain, discharge and redness.  Respiratory: Negative for cough, hemoptysis, sputum production, shortness of breath, wheezing and stridor.   Cardiovascular: Negative for chest pain, palpitations, orthopnea, claudication and leg swelling.  Gastrointestinal: Negative for nausea, vomiting and abdominal pain. Genitourinary: Negative for dysuria, urgency, frequency, hematuria and flank pain.  Musculoskeletal: Negative for myalgias, back pain, joint pain.  Skin: Negative for itching and rash.  Neurological: Negative for dizziness and weakness. Endo/Heme/Allergies: Negative for environmental allergies and polydipsia. Does not bruise/bleed easily.  Psychiatric/Behavioral: Negative for suicidal ideas. The patient is not nervous/anxious.      Past Medical History  Diagnosis Date  . Osteoporosis   . Vertigo   . Diverticulitis   . Rheumatoid arthritis [facility])     Past Surgical History  Procedure Laterality Date  . Appendectomy      Social History:  reports that she has never smoked. She does not have any smokeless tobacco history on file. She reports that she does not drink alcohol or use illicit drugs.  No Known Allergies  Pt reports no family history of HTN or DM  Prior to Admission medications   Medication Sig Start Date End Date Taking? Authorizing Provider  CALCIUM PO Take 1 tablet by mouth daily.   Yes Historical Provider, MD  Multiple  Vitamin (MULTIVITAMIN WITH MINERALS) TABS tablet Take 1 tablet by mouth daily.   Yes Historical Provider, MD    Physical Exam: Filed Vitals:   05/05/15 0942 05/05/15 1110 05/05/15 1130 05/05/15 1145  BP: 157/68 151/77 141/68 132/64  Pulse: 66 70 71 87  Temp: 97.9 F (36.6 C)      TempSrc: Oral     Resp: 18 18 13 14   SpO2: 98% 98%      Physical Exam  Constitutional: Appears well-developed and well-nourished. No distress.  HENT: Normocephalic. External right and left ear normal. Oropharynx is clear and moist.  Eyes: Conjunctivae and EOM are normal. PERRLA, no scleral icterus.  Neck: Normal ROM. Neck supple. No JVD. No tracheal deviation. No thyromegaly.  CVS: RRR, no gallops, no carotid bruit.  Pulmonary: Effort and breath sounds normal, no stridor, rhonchi, wheezes, rales.  Abdominal: Soft. BS +,  no distension, tenderness, rebound or guarding.  Musculoskeletal: Normal range of motion. TTP in the right hip area  Lymphadenopathy: No lymphadenopathy noted, cervical, inguinal. Neuro: Alert. Normal reflexes, muscle tone coordination. No cranial nerve deficit. Skin: Skin is warm and dry. No rash noted. Not diaphoretic. No erythema. No pallor.  Psychiatric: Normal mood and affect. Behavior, judgment, thought content normal.   Labs on Admission:  Basic Metabolic Panel:  Recent Labs Lab 05/05/15 1019  NA 139  K 4.6  CL 103  CO2 27  GLUCOSE 101*  BUN 19  CREATININE 0.88  CALCIUM 9.7   CBC:  Recent Labs Lab 05/05/15 1019  WBC 8.3  NEUTROABS 6.5  HGB 15.9*  HCT 48.2*  MCV 94.7  PLT 258   CBG:  Recent Labs Lab 05/05/15 1228  GLUCAP 111*    EKG: pending    If 7PM-7AM, please contact night-coverage [email or web address] Password [facility] 05/05/2015, 12:35 PM

## 2015-05-06 DIAGNOSIS — I9581 Postprocedural hypotension: Secondary | ICD-10-CM

## 2015-05-06 DIAGNOSIS — I959 Hypotension, unspecified: Secondary | ICD-10-CM | POA: Diagnosis present

## 2015-05-06 DIAGNOSIS — W108XXA Fall (on) (from) other stairs and steps, initial encounter: Secondary | ICD-10-CM

## 2015-05-06 DIAGNOSIS — D72829 Elevated white blood cell count, unspecified: Secondary | ICD-10-CM

## 2015-05-06 DIAGNOSIS — D62 Acute posthemorrhagic anemia: Secondary | ICD-10-CM

## 2015-05-06 LAB — BASIC METABOLIC PANEL
ANION GAP: 6 (ref 5–15)
BUN: 15 mg/dL (ref 6–20)
CALCIUM: 7.5 mg/dL — AB (ref 8.9–10.3)
CO2: 24 mmol/L (ref 22–32)
CREATININE: 0.81 mg/dL (ref 0.44–1.00)
Chloride: 108 mmol/L (ref 101–111)
GLUCOSE: 192 mg/dL — AB (ref 65–99)
Potassium: 4.1 mmol/L (ref 3.5–5.1)
Sodium: 138 mmol/L (ref 135–145)

## 2015-05-06 LAB — CBC
HCT: 33.8 % — ABNORMAL LOW (ref 36.0–46.0)
Hemoglobin: 11.2 g/dL — ABNORMAL LOW (ref 12.0–15.0)
MCH: 31.4 pg (ref 26.0–34.0)
MCHC: 33.1 g/dL (ref 30.0–36.0)
MCV: 94.7 fL (ref 78.0–100.0)
PLATELETS: 214 10*3/uL (ref 150–400)
RBC: 3.57 MIL/uL — ABNORMAL LOW (ref 3.87–5.11)
RDW: 13.1 % (ref 11.5–15.5)
WBC: 15.3 10*3/uL — AB (ref 4.0–10.5)

## 2015-05-06 MED ORDER — SODIUM CHLORIDE 0.9 % IV BOLUS (SEPSIS)
500.0000 mL | INTRAVENOUS | Status: AC
  Administered 2015-05-06: 500 mL via INTRAVENOUS

## 2015-05-06 MED ORDER — INFLUENZA VAC SPLIT QUAD 0.5 ML IM SUSY
0.5000 mL | PREFILLED_SYRINGE | INTRAMUSCULAR | Status: AC
  Administered 2015-05-07: 0.5 mL via INTRAMUSCULAR
  Filled 2015-05-06 (×2): qty 0.5

## 2015-05-06 MED ORDER — PROMETHAZINE HCL 25 MG/ML IJ SOLN
12.5000 mg | INTRAMUSCULAR | Status: DC | PRN
  Administered 2015-05-06: 12.5 mg via INTRAVENOUS
  Filled 2015-05-06: qty 1

## 2015-05-06 MED ORDER — SODIUM CHLORIDE 0.9 % IV BOLUS (SEPSIS)
1000.0000 mL | Freq: Once | INTRAVENOUS | Status: AC
  Administered 2015-05-06: 1000 mL via INTRAVENOUS

## 2015-05-06 MED ORDER — OXYCODONE-ACETAMINOPHEN 5-325 MG PO TABS
1.0000 | ORAL_TABLET | ORAL | Status: DC | PRN
  Administered 2015-05-06: 1 via ORAL
  Filled 2015-05-06: qty 1

## 2015-05-06 MED ORDER — OXYCODONE-ACETAMINOPHEN 5-325 MG PO TABS: 1.0000 | ORAL_TABLET | ORAL | Status: DC | PRN

## 2015-05-06 MED ORDER — ENSURE ENLIVE PO LIQD
237.0000 mL | Freq: Two times a day (BID) | ORAL | Status: DC
  Administered 2015-05-06 – 2015-05-08 (×4): 237 mL via ORAL

## 2015-05-06 NOTE — Progress Notes (Addendum)
Physical Therapy Treatment Patient Details Name: Stacey Hardin MRN: [id number] DOB: Dec 31, 1931 Today's Date: 05/06/2015    History of Present Illness Pt admitted after a fall and found to have a right hip fracture and a right radial neck fracture. Pt underwent R THA-DA 05/05/15 and conservative management of R radial neck fracture.    PT Comments    Progressing slowly with mobility. Limited by pain, nausea.   Follow Up Recommendations  Home health PT;Supervision/Assistance - 24 hour vs SNF (depending on progress)     Equipment Recommendations   R platform rolling walker. May also need to consider wheelchair.   Recommendations for Other Services       Precautions / Restrictions Precautions Precautions: Fall Precaution Comments: low BP Required Braces or Orthoses: Sling (for comfort vs elevating R UE on pillow) Restrictions Weight Bearing Restrictions: No RUE Weight Bearing: Weight bearing as tolerated RLE Weight Bearing: Weight bearing as tolerated Other Position/Activity Restrictions: Per MD, ok to weight bear R LE and R UE    Mobility  Bed Mobility Overal bed mobility: Needs Assistance Bed Mobility: Sit to Supine     Sit to supine: Max assist;+2 for physical assistance;+2 for safety/equipment;HOB elevated   General bed mobility comments: assist for trunk and R LE. Utilized bedpad for scooting, positioning. Increased time.   Transfers Overall transfer level: Needs assistance Equipment used: Right platform walker Transfers: Sit to/from Stand Sit to Stand: Mod assist;+2 physical assistance;+2 safety/equipment Stand pivot transfers: Mod assist;+2 physical assistance;+2 safety/equipment       General transfer comment: assist to rise, stabilize, control descent, position R UE/R LE. Multimodal cues for safety, technique, sequence. Intermittent assist needed to move R LE. Stand pivot from recliner to bed.   Ambulation/Gait   Stairs            Wheelchair  Mobility    Modified Rankin (Stroke Patients Only)       Balance Overall balance assessment: Needs assistance   Sitting balance-Leahy Scale: Fair     Standing balance support: Bilateral upper extremity supported;During functional activity Standing balance-Leahy Scale: Poor                      Cognition Arousal/Alertness: Awake/alert Behavior During Therapy: WFL for tasks assessed/performed Overall Cognitive Status: Within Functional Limits for tasks assessed                      Exercises      General Comments        Pertinent Vitals/Pain Pain Assessment: Faces Pain Score: 8  Faces Pain Scale: Hurts even more Pain Location: R hip with activity Pain Descriptors / Indicators: Aching;Sore Pain Intervention(s): Monitored during session;Repositioned    Home Living Family/patient expects to be discharged to:: Private residence Living Arrangements: Children Available Help at Discharge: Family;Available 24 hours/day Type of Home: House Home Access: Stairs to enter   Home Layout: One level Home Equipment: None      Prior Function Level of Independence: Independent          PT Goals (current goals can now be found in the care plan section) Acute Rehab PT Goals Patient Stated Goal: to return to more independence.  PT Goal Formulation: With patient/family Time For Goal Achievement: 05/20/15 Potential to Achieve Goals: Good Progress towards PT goals: Progressing toward goals (slowly. Limited by pain, nausea)    Frequency  7X/week    PT Plan Current plan remains appropriate    Co-evaluation  End of Session Equipment Utilized During Treatment: Gait belt Activity Tolerance: Patient limited by fatigue;Patient limited by pain Patient left: in bed;with call bell/phone within reach;with family/visitor present;with bed alarm set     Time: 4081-4481 PT Time Calculation (min) (ACUTE ONLY): 15 min  Charges:  $Therapeutic Activity: 8-22  mins                    G Codes:      Rebeca Alert, MPT Pager: [phone]

## 2015-05-06 NOTE — Progress Notes (Signed)
Utilization review completed.  

## 2015-05-06 NOTE — Progress Notes (Signed)
Initial Nutrition Assessment  INTERVENTION:   Need current weight obtained for this admission Provide Ensure Enlive po BID, each supplement provides 350 kcal and 20 grams of protein Encourage PO intake  NUTRITION DIAGNOSIS:   Increased nutrient needs related to other (see comment) (hip fracture/ surgical healing) as evidenced by estimated needs.   GOAL:   Patient will meet greater than or equal to 90% of their needs  MONITOR:   PO intake, Supplement acceptance, Labs, Weight trends, Skin, I & O's  REASON FOR ASSESSMENT:   Consult Assessment of nutrition requirement/status  ASSESSMENT:   81 yo female who [place] irregular surface.  Pt in room with daughter asleep at bedside. Per pt, her UBW is 100 lb. She reports no appetite or weight changes PTA. Pt states her appetite is slowly getting better today. She states she ate a few bites of cereal and drank coffee this morning. She is currently sipping on Ensure. RD to order. Encouraged pt to continue to drink at least 2 Ensures daily after discharge to aid in healing from surgery.  Nutrition-Focused physical exam completed. Findings are no fat depletion, mild-moderate muscle depletion, and no edema.   Labs reviewed: Glucose 192  Diet Order:  Diet regular Room service appropriate?: Yes; Fluid consistency:: Thin  Skin:  Wound (see comment) (hip incision)  Last BM:  10/12  Height:   Ht Readings from Last 1 Encounters:  No data found for Ht    Weight:   Wt Readings from Last 1 Encounters:  No data found for Wt    Ideal Body Weight:     BMI:  There is no height or weight on file to calculate BMI.  Estimated Nutritional Needs:   Kcal:  unable to calculate  Protein:     Fluid:     EDUCATION NEEDS:   Education needs addressed  Tilda Franco, MS, RD, LDN Pager: [phone] After Hours Pager: [phone]

## 2015-05-06 NOTE — Evaluation (Signed)
Physical Therapy Evaluation Patient Details Name: Stacey Hardin MRN: [id number] DOB: April 12, 1932 Today's Date: 05/06/2015   History of Present Illness  Pt admitted after a fall and found to have a right hip fracture and a right radial neck fracture. Pt underwent R THA-DA and conservative management of radial neck fracture.  Clinical Impression  On eval, pt required Mod assist +2 for mobility-pt took a few steps and performed stand pivot with R PFRW. Mobility limited by nausea, dizziness, pain. Discussed d/c plan-family would like to take pt home. Will continue to assess and progress activity as able.     Follow Up Recommendations Home health PT;Supervision/Assistance - 24 hour;SNF (depending on progress)    Equipment Recommendations   (R platform for rolling walker. May need to consider wheelchair as well.)    Recommendations for Other Services       Precautions / Restrictions Precautions Precautions: Fall Required Braces or Orthoses: Sling (for comfort) Restrictions Weight Bearing Restrictions: No RUE Weight Bearing: Weight bearing as tolerated RLE Weight Bearing: Weight bearing as tolerated Other Position/Activity Restrictions: Per MD, ok to weight bear R LE and R UE      Mobility  Bed Mobility Overal bed mobility: Needs Assistance Bed Mobility: Supine to Sit     Supine to sit: Mod assist;+2 for physical assistance;HOB elevated     General bed mobility comments: assist for trunk and R LE. Utilized bedpad for scooting, positioning. Increased time.   Transfers Overall transfer level: Needs assistance Equipment used: Right platform walker Transfers: Stand Pivot Transfers Sit to Stand: Mod assist;+2 physical assistance;+2 safety/equipment;From elevated surface Stand pivot transfers: Mod assist;+2 physical assistance;+2 safety/equipment       General transfer comment: assist to rise, stabilize, control descent, position R UE/R LE. Multimodal cues for safety, technique,  sequence.   Ambulation/Gait Ambulation/Gait assistance: Mod assist;+2 physical assistance;+2 safety/equipment Ambulation Distance (Feet): 3 Feet Assistive device: Right platform walker Gait Pattern/deviations: Step-to pattern;Antalgic;Wide base of support     General Gait Details: Assist to stabilize pt, maneuver with RW, advance R LE. Pt c/o nausea, dizziness so deferred further ambulation  Stairs            Wheelchair Mobility    Modified Rankin (Stroke Patients Only)       Balance Overall balance assessment: Needs assistance   Sitting balance-Leahy Scale: Fair     Standing balance support: Bilateral upper extremity supported;During functional activity Standing balance-Leahy Scale: Poor                               Pertinent Vitals/Pain Pain Assessment: 0-10 Pain Score: 8  Pain Location: R hip Pain Descriptors / Indicators: Sore;Aching Pain Intervention(s): Monitored during session;Repositioned    Home Living Family/patient expects to be discharged to:: Private residence Living Arrangements: Children Available Help at Discharge: Family;Available 24 hours/day Type of Home: House Home Access: Stairs to enter   [employer] of Steps: 1 Home Layout: One level Home Equipment: None      Prior Function Level of Independence: Independent               Hand Dominance        Extremity/Trunk Assessment   Upper Extremity Assessment: Defer to OT evaluation RUE Deficits / Details: R UE splinted at elbow and wrist so unable to assess ROM. Pt able to raise R UE over head. able to flex/extend digits but note significant edema in digits. Full flexion blocked by  splint. RUE: Unable to fully assess due to immobilization       Lower Extremity Assessment: RLE deficits/detail RLE Deficits / Details: leg appears longer. Able to move ankle.     Cervical / Trunk Assessment: Normal  Communication   Communication: No difficulties   Cognition Arousal/Alertness: Awake/alert Behavior During Therapy: WFL for tasks assessed/performed Overall Cognitive Status: Within Functional Limits for tasks assessed                      General Comments      Exercises        Assessment/Plan    PT Assessment Patient needs continued PT services  PT Diagnosis Difficulty walking;Abnormality of gait;Generalized weakness;Acute pain   PT Problem List Decreased strength;Decreased range of motion;Decreased activity tolerance;Decreased balance;Decreased mobility;Decreased knowledge of use of DME;Pain;Decreased knowledge of precautions  PT Treatment Interventions DME instruction;Gait training;Functional mobility training;Therapeutic activities;Patient/family education;Balance training;Therapeutic exercise   PT Goals (Current goals can be found in the Care Plan section) Acute Rehab PT Goals Patient Stated Goal: to return to more independence.  PT Goal Formulation: With patient/family Time For Goal Achievement: 05/20/15 Potential to Achieve Goals: Good    Frequency 7X/week   Barriers to discharge        Co-evaluation PT/OT/SLP Co-Evaluation/Treatment: Yes Reason for Co-Treatment: Complexity of the patient's impairments (multi-system involvement);For patient/therapist safety PT goals addressed during session: Mobility/safety with mobility;Proper use of DME OT goals addressed during session: ADL's and self-care;Proper use of Adaptive equipment and DME       End of Session Equipment Utilized During Treatment: Gait belt Activity Tolerance: Patient limited by fatigue;Patient limited by pain Patient left: in chair;with call bell/phone within reach;with family/visitor present           Time: 6226-3335 PT Time Calculation (min) (ACUTE ONLY): 24 min   Charges:   PT Evaluation $Initial PT Evaluation Tier I: 1 Procedure     PT G Codes:        Rebeca Alert, MPT Pager: [phone]

## 2015-05-06 NOTE — Progress Notes (Signed)
Subjective: 1 Day Post-Op Procedure(s) (LRB): RIGHT ANTERIOR APPROACH HIP ARTHROPLASTY (Right) Patient reports pain as moderate.  Acute blood loss anemia from surgery.  Not a large drop, but she is hypotensive.  Bolus already ordered  Objective: Vital signs in last 24 hours: Temp:  [97.1 F (36.2 C)-97.9 F (36.6 C)] 97.8 F (36.6 C) (10/13 0548) Pulse Rate:  [65-96] 84 (10/13 0800) Resp:  [12-20] 16 (10/13 0800) BP: (82-157)/(40-77) 88/45 mmHg (10/13 0800) SpO2:  [88 %-100 %] 96 % (10/13 0800)  Intake/Output from previous day: 10/12 0701 - 10/13 0700 In: 3377.5 [P.O.:240; I.V.:3137.5] Out: 1500 [Urine:1000; Blood:500] Intake/Output this shift:     Recent Labs  05/05/15 1019 05/06/15 0435  HGB 15.9* 11.2*    Recent Labs  05/05/15 1019 05/06/15 0435  WBC 8.3 15.3*  RBC 5.09 3.57*  HCT 48.2* 33.8*  PLT 258 214    Recent Labs  05/05/15 1019 05/06/15 0435  NA 139 138  K 4.6 4.1  CL 103 108  CO2 27 24  BUN 19 15  CREATININE 0.88 0.81  GLUCOSE 101* 192*  CALCIUM 9.7 7.5*   No results for input(s): LABPT, INR in the last 72 hours.  Sensation intact distally Intact pulses distally Dorsiflexion/Plantar flexion intact Incision: dressing C/D/I Right arm splinted   Assessment/Plan: 1 Day Post-Op Procedure(s) (LRB): RIGHT ANTERIOR APPROACH HIP ARTHROPLASTY (Right) Up with therapy  WBAT right arm in splint and right hip using assistive device ( type of walker per PT pending how she mobilizes easiest)  Jaymir Struble Y 05/06/2015, 9:19 AM

## 2015-05-06 NOTE — Op Note (Signed)
NAMECYNCERE, RUHE NO.:  [id number]  MEDICAL RECORD NO.:  [id number]  LOCATION:  1605                         FACILITY:  [facility]  PHYSICIAN:  Vanita Panda. Magnus Ivan, M.D.DATE OF BIRTH:  Oct 17, 1931  DATE OF PROCEDURE:  05/05/2015 DATE OF DISCHARGE:                              OPERATIVE REPORT   PREOPERATIVE DIAGNOSIS:  Right hip displaced femoral neck fracture.  POSTOPERATIVE DIAGNOSIS:  Right hip displaced femoral neck fracture.  PROCEDURE:  Right total hip arthroplasty through direct anterior approach.  IMPLANTS:  DePuy Sector Gription acetabular component size 48, size 32 + 0 neutral polyethylene liner, size 11 Corail femoral component with varus offset, size 32+ 1 ceramic hip ball.  SURGEON:  Kathryne Hitch, MD.  ASSISTANT:  Burnard Bunting, MD.  ANESTHESIA:  General.  ANTIBIOTICS:  2 g IV Ancef.  BLOOD LOSS:  500 mL.  COMPLICATIONS:  None.  INDICATIONS:  Ms. Peaster is a very pleasant 81 year old female who sustained accidental mechanical fall today when she is out in public. She has never had hip pain before in her right hip and has never ambulated with an assistive device and has been steady in her feet and is very active.  After her fall, she had inability to put weight on her right hip with severe right hip pain and also right elbow pain.  She had a lot of small laceration over right eye as well.  She was taken to the [facility] Emergency Room where she was discovered to have these injuries.  She was admitted to the Medicine service and cleared for surgery.  She had CT scans of her head and neck which were normal, and she had laceration repaired of her eye.  She also was found to have a displaced right femoral neck fracture and a nondisplaced right elbow radial neck fracture.  The radial neck were be treated with a splint. 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  The risks and benefits of surgery were explained to her in detail and she does understand that need to proceed with surgery.  DESCRIPTION OF PROCEDURE:  After informed consent was obtained, appropriate right hip was marked.  She was brought to the operating room and general anesthesia was obtained while she was on her stretcher.  She was next placed supine on the Hana fracture table with perineal post in place and both legs in inline skeletal traction devices, but no traction applied.  Her right operative hip was then prepped and draped with DuraPrep and sterile drapes.  Time-out was called, and she was identified as correct patient, correct right hip.  We then made an incision inferior and posterior to the anterosuperior iliac spine and carried this obliquely down the leg.  We dissected down tensor fascia lata muscle and tensor fascia was then divided longitudinally, so we could proceed with a direct anterior approach to the hip.  We identified and cauterized the lateral femoral circumflex vessels and then identified the hip capsule and opening the hip capsule in  an L-type format and finding a large hematoma from her fracture.  We then made our femoral neck cut proximal to the lesser trochanter and completed this on osteotome.  We placed corkscrew guide in the femoral head and removed the small femoral head without difficulty.  We then cleaned the acetabular labrum and debris and then began reaming under direct visualization from a size 42 up to a size 48.  With size 48 in place under direct fluoroscopy, we placed the real size 48 femoral component and 2 screws to help secure it because she is very small individual and a very shallow socket.  We then went to the femur.  Attention was turned to the femur with the leg externally rotated to 100 degrees, extended and adducted.  We were able to place a Mueller retractor  medially and a Hohmann retractor behind the greater trochanter and released the lateral joint capsule and then opened up the femoral canal using a rongeur and a box cutting osteotome and lateralize as well.  We then began broaching up from a size 8 broach up to a size 11 which was nice and tight and stable and given that it did not look like we are going to have the __________ varus offset neck and a 32+ 1 hip ball, reduced in the pelvis.  We were pleased with stability and offset and felt like she was just a little bit longer and we had no other choices for shortening possibilities.  we then removed these trial components and placed the real Corail femoral component with standard offset and the real 32+ 1 ceramic hip ball and reduced this in the acetabulum.  Again, we were pleased with stability offset and leg lengths.  We then irrigated the wound with normal saline solution.  I also was able to close the joint capsule with interrupted #1 Ethibond suture followed by running #1 Vicryl in the tensor fascia, 0 Vicryl in deep tissue, 2-0 Vicryl in subcutaneous tissue, 4-0 Monocryl subcuticular stitch, and Steri-Strips on the skin.  An Aquacel dressing was applied.  She was then taken off the Hana table awakened, extubated, and taken to recovery room in stable condition.  All final counts were correct.  There were no complications noted.     Vanita Panda. Magnus Ivan, M.D.     CYB/MEDQ  D:  05/05/2015  T:  05/06/2015  Job:  [id number]

## 2015-05-06 NOTE — Clinical Social Work Note (Signed)
Clinical Social Work Assessment  Patient Details  Name: Stacey Hardin MRN: [id number] Date of Birth: 03/24/1932  Date of referral:  05/06/15               Reason for consult:  Facility Placement                Permission sought to share information with:  Family Supports Permission granted to share information::  Yes, Verbal Permission Granted  Name::      (daughter- Stacey Hardin)  Agency::   (area SNF's)  Relationship::   (daughter and granddaughter)  Sport and exercise psychologist Information:     Housing/Transportation Living arrangements for the past 2 months:  [place] of Information:  Patient, Adult Children Patient Interpreter Needed:  None Criminal Activity/Legal Involvement Pertinent to Current Situation/Hospitalization:  No - Comment as needed Significant Relationships:  Adult Children Lives with:  Self Do you feel safe going back to the place where you live?  Yes Need for family participation in patient care:  No (Coment)  Care giving concerns:  CSW may need SNF   Social Worker assessment / plan:  CSW met with patient and her family at bedside- per patient, she and her daughter, Stacey Hardin, were [place] cour[place] when she fell and broke her hip. Patient is independent and lives in [place] (granddaughter lives here). Patient is hoping to progress and be able to go home at dc with family support (sister, granddaughter and daughter) along with HH/DME as needed. CSW discussed possible SNF for rehab and she would be open to this if she needs it and prefers to return to her [place] or [facility] in [place], [place]).  CSW will complete FL2 and PASARR for ?SNF  Employment status:  Retired Nurse, adult PT Recommendations:  [place] / Referral to community resources:  [place]  Patient/Family's Response to care:  Both patient and her family agree with considering both SNF  and home options- as she progresses, they along with hospital team can further decide which setting will be best.    Patient/Family's Understanding of and Emotional Response to Diagnosis, Current Treatment, and Prognosis:  Patient is disappointed of course of her fall and hip and arm fx's-  "never broke a bone before". She is optimistic and eager to progress and get home.   Emotional Assessment Appearance:  Appears younger than stated age Attitude/Demeanor/Rapport:   (positive) Affect (typically observed):  Accepting, Appropriate Orientation:  Oriented to Self, Oriented to Place, Oriented to  Time, Oriented to Situation Alcohol / Substance use:  Not Applicable Psych involvement (Current and /or in the community):  No (Comment)  Discharge Needs  Concerns to be addressed:  Discharge Planning Concerns Readmission within the last 30 days:  No Current discharge risk:  None Barriers to Discharge:  No Barriers Identified   Ludwig Clarks, LCSW 05/06/2015, 3:38 PM

## 2015-05-06 NOTE — Progress Notes (Signed)
Patient ID: Stacey Hardin, female   DOB: Apr 22, 1932, 81 y.o.   MRN: [id number]  [facility] PROGRESS NOTE  Allie Ousley ZTI:[id number] DOB: May 04, 1932 DOA: 2015/06/04 PCP: Colon Branch, MD   Brief narrative:    Pt is 81 yo female with no specific medical history who presented to [facility] ED after an episode of fall while she [place] her [place], she lost her balance while climbing the stairs and fell on her right side. She currently reports pain to be constant and sharp, right hip area, 7/10 in severity, non radiating, worse with movement and with no specific alleviating factors. Pt reports being unable to bear any weight. She denies fevers, chills, chest pain or shortness of breath, no similar events in the past.   In ED, pt noted to be hemodynamically stable, VSS, imaging studies notable for right hip femoral neck fracture. [facility] asked to admit for further evaluation and ortho team consulted.    Assessment/Plan:    Active Problems: Fall - appears to be mechanical - will need PT eval   Right hip femoral neck fracture - s/p hip replacement, post op day #1 - appreciate assistance - provide analgesia as needed   Post op anemia  - hold off on transfusion at this time - CBC In AM  Leukocytosis - post op related - no sings of an infectious etiology - CBC in AM  Hypotension - post op and with acute blood anemia - bolus with NS for now, monitor as pt is asymptomatic at this time   DVT prophylaxis - Lovenox SQ  Code Status: Full.  Family Communication:  plan of care discussed with the patient Disposition Plan: SNF vs Home   IV access:  Peripheral IV  Procedures and diagnostic studies:     Ct Head Wo Contrast 06/04/15 Negative for bleed or other acute intracranial process. 2. Atrophy and nonspecific white matter changes.   Dg Hip Unilat With Pelvis 2-3 Views Right 2015/06/04 Impacted fracture of right femoral neck cannot be excluded.   Dg Femur 1v  Right Jun 04, 2015 There is an acute impacted fracture of the junction of the right humeral head and neck. More distally no acute abnormality of the femur is demonstrated  Medical Consultants:  Ortho  Other Consultants:  PT   IAnti-Infectives:   None  Debbora Presto, MD  [facility] Pager [phone]  If 7PM-7AM, please contact night-coverage [email or web address] Password [email or web address] 05/06/2015, 7:04 AM   LOS: 1 day   HPI/Subjective: No events overnight.   Objective: Filed Vitals:   06/04/2015 2134 06-04-15 2329 05/06/15 0042 05/06/15 0548  BP: 108/45 92/43 97/47  82/44  Pulse: 80 87 81 82  Temp: 97.9 F (36.6 C) 97.7 F (36.5 C) 97.6 F (36.4 C) 97.8 F (36.6 C)  TempSrc:  Oral Axillary Oral  Resp: 16 12 14 12   SpO2: 100% 100% 100% 100%    Intake/Output Summary (Last 24 hours) at 05/06/15 0704 Last data filed at 05/06/15 0615  Gross per 24 hour  Intake 3377.5 ml  Output   1500 ml  Net 1877.5 ml    Exam:   General:  Pt is alert, follows commands appropriately, not in acute distress  Cardiovascular: Regular rate and rhythm, no rubs, no gallops  Respiratory: Clear to auscultation bilaterally, no wheezing, no crackles, no rhonchi  Abdomen: Soft, non tender, non distended, bowel sounds present, no guarding  Data Reviewed: Basic Metabolic Panel:  Recent Labs Lab 04-Jun-2015 1019 05/06/15 0435  NA 139 138  K  4.6 4.1  CL 103 108  CO2 27 24  GLUCOSE 101* 192*  BUN 19 15  CREATININE 0.88 0.81  CALCIUM 9.7 7.5*   Liver Function Tests: No results for input(s): AST, ALT, ALKPHOS, BILITOT, PROT, ALBUMIN in the last 168 hours. No results for input(s): LIPASE, AMYLASE in the last 168 hours. No results for input(s): AMMONIA in the last 168 hours. CBC:  Recent Labs Lab 05/05/15 1019 05/06/15 0435  WBC 8.3 15.3*  NEUTROABS 6.5  --   HGB 15.9* 11.2*  HCT 48.2* 33.8*  MCV 94.7 94.7  PLT 258 214   Cardiac Enzymes: No results for input(s): CKTOTAL, CKMB, CKMBINDEX,  TROPONINI in the last 168 hours. BNP: Invalid input(s): POCBNP CBG:  Recent Labs Lab 05/05/15 1228  GLUCAP 111*    Recent Results (from the past 240 hour(s))  Surgical pcr screen     Status: None   Collection Time: 05/05/15  3:54 PM  Result Value Ref Range Status   MRSA, PCR NEGATIVE NEGATIVE Final   Staphylococcus aureus NEGATIVE NEGATIVE Final    Comment:        The Xpert SA Assay (FDA approved for NASAL specimens in patients over 21 years of age), is one component of a comprehensive surveillance program.  Test performance has been validated by [facility] for patients greater than or equal to 70 year old. It is not intended to diagnose infection nor to guide or monitor treatment.      Scheduled Meds: . aspirin EC  325 mg Oral BID PC  . docusate sodium  100 mg Oral BID  . enoxaparin (LOVENOX) injection  40 mg Subcutaneous Q24H  . HYDROmorphone      . [START ON 05/07/2015] Influenza vac split quadrivalent PF  0.5 mL Intramuscular Tomorrow-1000  . pantoprazole (PROTONIX) IV  40 mg Intravenous Q24H   Continuous Infusions: . sodium chloride 75 mL/hr at 05/05/15 2202

## 2015-05-06 NOTE — Evaluation (Signed)
Occupational Therapy Evaluation Patient Details Name: Stacey Hardin MRN: [id number] DOB: [birth year] Today's Date: 05/06/2015    History of Present Illness Pt admitted after a fall and found to have a right hip fracture and a right radial neck fracture. Pt underwent R THA-DA and conservative management of radial neck fracture.   Clinical Impression   Pt up with +2 mod assist for functional transfer to recliner. Daughter present for session and states pt will have 24/7 assist available. Will follow to progress ADL independence. Recommend home with HHOT versus SNF depending on pt ability to progress on acute.     Follow Up Recommendations  Home health OT;SNF;Other (comment);Supervision/Assistance - 24 hour (depending on progress. If not able to progress to level for family to manage at home, will need SNF)    Equipment Recommendations  3 in 1 bedside comode    Recommendations for Other Services       Precautions / Restrictions Precautions Precautions: Fall Required Braces or Orthoses: Sling (for comfort) Restrictions Weight Bearing Restrictions: No Other Position/Activity Restrictions: Per MD, ok to weight bear R LE and R UE      Mobility Bed Mobility Overal bed mobility: Needs Assistance;+2 for physical assistance;+ 2 for safety/equipment Bed Mobility: Supine to Sit     Supine to sit: +2 for physical assistance;Mod assist;+2 for safety/equipment     General bed mobility comments: use of bed pad to help with scooting around to EOB. Assist for R LE off the bed.   Transfers Overall transfer level: Needs assistance Equipment used: Right platform walker Transfers: Sit to/from Stand Sit to Stand: +2 physical assistance;Mod assist;+2 safety/equipment         General transfer comment: assist to rise and balance. Cues for hand placement and LE management. Pt able to help bring R UE onto platform.    Balance Overall balance assessment: Needs assistance   Sitting  balance-Leahy Scale: Fair       Standing balance-Leahy Scale: Poor                              ADL Overall ADL's : Needs assistance/impaired Eating/Feeding: Set up;Sitting   Grooming: Wash/dry face;Set up;Bed level   Upper Body Bathing: Moderate assistance;Sitting   Lower Body Bathing: +2 for physical assistance;Maximal assistance;Sit to/from stand;+2 for safety/equipment   Upper Body Dressing : Moderate assistance;Sitting   Lower Body Dressing: +2 for physical assistance;Total assistance;Sit to/from stand;+2 for safety/equipment   Toilet Transfer: +2 for physical assistance;+2 for safety/equipment;Moderate assistance;Stand-pivot;RW Toilet Transfer Details (indicate cue type and reason): pt took a few steps to turn wtih PFRW and chair brought up behind her. Toileting- Clothing Manipulation and Hygiene: +2 for physical assistance;Total assistance;+2 for safety/equipment;Sit to/from stand         General ADL Comments: Pt stating she was feeling a little nauseous at EOB but no complaint of lightheadedness. BP sitting EOB 105/38 and in recliner 95/48. Pt able to use PFRW to take a few steps to turn 1/4 and chair brought up behind her. Encouraged pt to have sling off some during the day to perform AROM of R shoulder to keep her from getting stiff and also digit ROM R hand to help with edema. daughter present for session.      Vision     Perception     Praxis      Pertinent Vitals/Pain Pain Assessment: 0-10 Pain Score: 8  Pain Location: R hip Pain Descriptors /  Indicators: Aching Pain Intervention(s): Monitored during session;Repositioned     Hand Dominance     Extremity/Trunk Assessment Upper Extremity Assessment Upper Extremity Assessment: RUE deficits/detail RUE Deficits / Details: R UE splinted at elbow and wrist so unable to assess ROM. Pt able to raise R UE over head. able to flex/extend digits but note significant edema in digits. Full flexion blocked  by splint. RUE: Unable to fully assess due to immobilization           Communication Communication Communication: No difficulties   Cognition Arousal/Alertness: Awake/alert Behavior During Therapy: WFL for tasks assessed/performed Overall Cognitive Status: Within Functional Limits for tasks assessed                     General Comments       Exercises       Shoulder Instructions      Home Living Family/patient expects to be discharged to:: Private residence Living Arrangements: Children Available Help at Discharge: Family;Available 24 hours/day Type of Home: House Home Access: Stairs to enter [employer] of Steps: 1   Home Layout: One level     Bathroom Shower/Tub: Tub/shower unit;Walk-in shower   Bathroom Toilet: Standard     Home Equipment: None          Prior Functioning/Environment Level of Independence: Independent             OT Diagnosis: Generalized weakness;Acute pain   OT Problem List: Decreased strength;Decreased knowledge of use of DME or AE;Impaired UE functional use   OT Treatment/Interventions: Self-care/ADL training;Patient/family education;Therapeutic activities;DME and/or AE instruction    OT Goals(Current goals can be found in the care plan section) Acute Rehab OT Goals Patient Stated Goal: to return to more independence.  OT Goal Formulation: With patient/family Time For Goal Achievement: 05/13/15 Potential to Achieve Goals: Good  OT Frequency: Min 2X/week   Barriers to D/C:            Co-evaluation PT/OT/SLP Co-Evaluation/Treatment: Yes Reason for Co-Treatment: Complexity of the patient's impairments (multi-system involvement);For patient/therapist safety   OT goals addressed during session: ADL's and self-care;Proper use of Adaptive equipment and DME      End of Session Equipment Utilized During Treatment: Gait belt;Other (comment) (platform walker)  Activity Tolerance: Patient limited by  pain Patient left: in chair;with call bell/phone within reach;with family/visitor present   Time: 1124-1150 OT Time Calculation (min): 26 min Charges:  OT General Charges $OT Visit: 1 Procedure OT Evaluation $Initial OT Evaluation Tier I: 1 Procedure G-Codes:    Lennox Laity  [phone] 05/06/2015, 12:45 PM

## 2015-05-06 NOTE — Clinical Social Work Placement (Signed)
   CLINICAL SOCIAL WORK PLACEMENT  NOTE  Date:  05/06/2015  Patient Details  Name: Stacey Hardin MRN: [id number] Date of Birth: 1932-06-02  Clinical Social Work is seeking post-discharge placement for this patient at the Skilled  Nursing Facility level of care (*CSW will initial, date and re-position this form in  chart as items are completed):  No   Patient/family provided with [facility] Clinical Social Work Department's list of facilities offering this level of care within the geographic area requested by the patient (or if unable, by the patient's family).  Yes   Patient/family informed of their freedom to choose among providers that offer the needed level of care, that participate in Medicare, Medicaid or managed care program needed by the patient, have an available bed and are willing to accept the patient.  No   Patient/family informed of [facility]'s ownership interest in [facility] and [facility], as well as of the fact that they are under no obligation to receive care at these facilities.  PASRR submitted to EDS on 05/06/15     PASRR number received on 05/06/15     Existing PASRR number confirmed on       FL2 transmitted to all facilities in geographic area requested by pt/family on 05/06/15     FL2 transmitted to all facilities within larger geographic area on       Patient informed that his/her managed care company has contracts with or will negotiate with certain facilities, including the following:            Patient/family informed of bed offers received.  Patient chooses bed at       Physician recommends and patient chooses bed at      Patient to be transferred to   on  .  Patient to be transferred to facility by       Patient family notified on   of transfer.  Name of family member notified:        PHYSICIAN Please sign FL2, Please prepare priority discharge summary, including medications     Additional Comment:     _______________________________________________ Liliana Cline, LCSW 05/06/2015, 3:49 PM

## 2015-05-06 NOTE — Anesthesia Postprocedure Evaluation (Signed)
  Anesthesia Post-op Note  Patient: Stacey Hardin  Procedure(s) Performed: Procedure(s) (LRB): RIGHT ANTERIOR APPROACH HIP ARTHROPLASTY (Right)  Patient Location: PACU  Anesthesia Type: General  Level of Consciousness: awake and alert   Airway and Oxygen Therapy: Patient Spontanous Breathing  Post-op Pain: mild  Post-op Assessment: Post-op Vital signs reviewed, Patient's Cardiovascular Status Stable, Respiratory Function Stable, Patent Airway and No signs of Nausea or vomiting  Last Vitals:  Filed Vitals:   05/05/15 2329  BP: 92/43  Pulse: 87  Temp: 36.5 C  Resp: 12    Post-op Vital Signs: stable   Complications: No apparent anesthesia complications

## 2015-05-07 ENCOUNTER — Inpatient Hospital Stay (HOSPITAL_COMMUNITY): Payer: Medicare Other

## 2015-05-07 DIAGNOSIS — S72001A Fracture of unspecified part of neck of right femur, initial encounter for closed fracture: Secondary | ICD-10-CM | POA: Diagnosis not present

## 2015-05-07 DIAGNOSIS — D696 Thrombocytopenia, unspecified: Secondary | ICD-10-CM

## 2015-05-07 DIAGNOSIS — I950 Idiopathic hypotension: Secondary | ICD-10-CM

## 2015-05-07 DIAGNOSIS — I959 Hypotension, unspecified: Secondary | ICD-10-CM

## 2015-05-07 DIAGNOSIS — S72009A Fracture of unspecified part of neck of unspecified femur, initial encounter for closed fracture: Secondary | ICD-10-CM

## 2015-05-07 LAB — BASIC METABOLIC PANEL
ANION GAP: 2 — AB (ref 5–15)
BUN: 13 mg/dL (ref 6–20)
CALCIUM: 7.1 mg/dL — AB (ref 8.9–10.3)
CO2: 24 mmol/L (ref 22–32)
CREATININE: 0.77 mg/dL (ref 0.44–1.00)
Chloride: 111 mmol/L (ref 101–111)
Glucose, Bld: 99 mg/dL (ref 65–99)
Potassium: 3.7 mmol/L (ref 3.5–5.1)
SODIUM: 137 mmol/L (ref 135–145)

## 2015-05-07 LAB — CBC
HEMATOCRIT: 28.8 % — AB (ref 36.0–46.0)
Hemoglobin: 9.5 g/dL — ABNORMAL LOW (ref 12.0–15.0)
MCH: 31.4 pg (ref 26.0–34.0)
MCHC: 33 g/dL (ref 30.0–36.0)
MCV: 95 fL (ref 78.0–100.0)
PLATELETS: 134 10*3/uL — AB (ref 150–400)
RBC: 3.03 MIL/uL — ABNORMAL LOW (ref 3.87–5.11)
RDW: 13.5 % (ref 11.5–15.5)
WBC: 10.4 10*3/uL (ref 4.0–10.5)

## 2015-05-07 LAB — URINE MICROSCOPIC-ADD ON

## 2015-05-07 LAB — URINALYSIS, ROUTINE W REFLEX MICROSCOPIC
BILIRUBIN URINE: NEGATIVE
Glucose, UA: NEGATIVE mg/dL
KETONES UR: NEGATIVE mg/dL
NITRITE: NEGATIVE
PROTEIN: NEGATIVE mg/dL
Specific Gravity, Urine: 1.013 (ref 1.005–1.030)
UROBILINOGEN UA: 0.2 mg/dL (ref 0.0–1.0)
pH: 5.5 (ref 5.0–8.0)

## 2015-05-07 MED ORDER — OXYCODONE-ACETAMINOPHEN 5-325 MG PO TABS: 1.0000 | ORAL_TABLET | ORAL | Status: AC | PRN

## 2015-05-07 MED ORDER — ASPIRIN 325 MG PO TBEC: 325.0000 mg | DELAYED_RELEASE_TABLET | Freq: Two times a day (BID) | ORAL | Status: AC

## 2015-05-07 NOTE — Progress Notes (Signed)
Subjective: 2 Days Post-Op Procedure(s) (LRB): RIGHT ANTERIOR APPROACH HIP ARTHROPLASTY (Right) Patient reports pain as moderate.   Slow progress with PT Objective: Vital signs in last 24 hours: Temp:  [97.9 F (36.6 C)-101.1 F (38.4 C)] 98.9 F (37.2 C) (10/14 0542) Pulse Rate:  [78-93] 78 (10/14 0542) Resp:  [16] 16 (10/14 0542) BP: (93-107)/(38-51) 106/46 mmHg (10/14 0542) SpO2:  [96 %-100 %] 98 % (10/14 0542) Weight:  [45.36 kg (100 lb)] 45.36 kg (100 lb) (10/13 1400)  Intake/Output from previous day: 10/13 0701 - 10/14 0700 In: 3086.3 [P.O.:360; I.V.:1676.3; IV Piggyback:1050] Out: 900 [Urine:900] Intake/Output this shift:     Recent Labs  05/05/15 1019 05/06/15 0435 05/07/15 0450  HGB 15.9* 11.2* 9.5*    Recent Labs  05/06/15 0435 05/07/15 0450  WBC 15.3* 10.4  RBC 3.57* 3.03*  HCT 33.8* 28.8*  PLT 214 134*    Recent Labs  05/06/15 0435 05/07/15 0450  NA 138 137  K 4.1 3.7  CL 108 111  CO2 24 24  BUN 15 13  CREATININE 0.81 0.77  GLUCOSE 192* 99  CALCIUM 7.5* 7.1*   No results for input(s): LABPT, INR in the last 72 hours.  Right lower extremity Sensation intact distally Intact pulses distally Dorsiflexion/Plantar flexion intact Incision: dressing C/D/I Compartment soft  Assessment/Plan: 2 Days Post-Op Procedure(s) (LRB): RIGHT ANTERIOR APPROACH HIP ARTHROPLASTY (Right) Up with therapy  Monitor for symptoms of anemia  CLARK, GILBERT 05/07/2015, 9:57 AM

## 2015-05-07 NOTE — Progress Notes (Signed)
CSW continuing to follow.   CSW followed up with pt, pt daughter, and pt granddaughter at bedside.   CSW introduced self and explained role.   CSW discussed that CSW following up regarding disposition plan. Pt feels that she will need short term rehab before returning home.   CSW discussed with pt that [facility] notified this CSW that facility was able to offer pt a private suite. CSW explained to pt that [facility] did not receive referral, but CSW can fax referral if pt wishes. Pt states that she preferred [facility] and agreeable to that facility for rehab.  CSW discussed with pt and pt family surrounding transportation to SNF. Pt and pt daughter feel that pt will be safe to transport via private vehicle. CSW explained that it pt opts for ambulance transport that ambulance transport would have to be paid up front facility is over a 50 mile radius. Pt expressed understanding and feels that private vehicle transport will work and pt daughter agrees.   CSW notified admissions coordinator, Shanda Bumps at [facility] who confirmed that facility can accept pt. [facility] can accept pt over the weekend. Weekend Child psychotherapist to facilitate pt discharge needs over the weekend if pt medically ready.  CSW to continue to follow to provide support and assist with pt disposition needs.  Loletta Specter, MSW, LCSW Clinical Social Work Coverage for [employer] [phone]

## 2015-05-07 NOTE — Care Management Important Message (Signed)
Important Message  Patient Details  Name: Dessiree Sze MRN: [id number] Date of Birth: 01-18-1932   Medicare Important Message Given:  Yes-second notification given    Haskell Flirt 05/07/2015, 1:23 PMImportant Message  Patient Details  Name: Lakashia Collison MRN: [id number] Date of Birth: Feb 03, 1932   Medicare Important Message Given:  Yes-second notification given    Haskell Flirt 05/07/2015, 1:23 PM

## 2015-05-07 NOTE — Progress Notes (Signed)
Physical Therapy Treatment Patient Details Name: Stacey Hardin MRN: [id number] DOB: 12/31/1931 Today's Date: 05/07/2015    History of Present Illness Pt admitted after a fall and found to have a right hip fracture and a right radial neck fracture. Pt underwent R THA-DA 05/05/15 and conservative management of R radial neck fracture.    PT Comments    Assisted pt OOB to amb using R platform RW + 2 assist for safety.    Follow Up Recommendations  Home health PT;Supervision/Assistance - 24 hour;SNF     Equipment Recommendations       Recommendations for Other Services       Precautions / Restrictions Precautions Precautions: Fall Restrictions RUE Weight Bearing: Non weight bearing RLE Weight Bearing: Weight bearing as tolerated Other Position/Activity Restrictions: Per MD, ok to weight bear R LE and R UE    Mobility  Bed Mobility Overal bed mobility: Needs Assistance Bed Mobility: Supine to Sit     Supine to sit: Mod assist;+2 for physical assistance;HOB elevated     General bed mobility comments: assist for trunk and R LE. Utilized bedpad for scooting, positioning. Increased time.   Transfers Overall transfer level: Needs assistance Equipment used: Right platform walker;Rolling walker (2 wheeled) Transfers: Sit to/from Stand Sit to Stand: Mod assist;+2 physical assistance;+2 safety/equipment         General transfer comment: 25% VC's on proper tech and + 2 assist for safety.  Extra assist for stand to sit.  Ambulation/Gait Ambulation/Gait assistance: Min assist;+2 physical assistance;+2 safety/equipment Ambulation Distance (Feet): 10 Feet Assistive device: Right platform walker;Rolling walker (2 wheeled) Gait Pattern/deviations: Step-to pattern;Decreased stance time - right     General Gait Details: Assist to stabilize pt, maneuver with RW, advance R LE. Pt c/o mild nausea.  Did tolerate amb today.   Stairs            Wheelchair Mobility    Modified  Rankin (Stroke Patients Only)       Balance                                    Cognition Arousal/Alertness: Awake/alert Behavior During Therapy: WFL for tasks assessed/performed Overall Cognitive Status: Within Functional Limits for tasks assessed                      Exercises      General Comments        Pertinent Vitals/Pain Pain Assessment: 0-10 Pain Score: 8  Pain Location: R hip Pain Descriptors / Indicators: Aching;Sore Pain Intervention(s): Monitored during session;Repositioned    Home Living                      Prior Function            PT Goals (current goals can now be found in the care plan section) Progress towards PT goals: Progressing toward goals    Frequency  Min 5X/week    PT Plan      Co-evaluation             End of Session Equipment Utilized During Treatment: Gait belt Activity Tolerance: Patient limited by fatigue;Patient limited by pain Patient left: in chair;with call bell/phone within reach     Time: 1045-1110 PT Time Calculation (min) (ACUTE ONLY): 25 min  Charges:  $Gait Training: 8-22 mins $Therapeutic Activity: 8-22 mins  G Codes:      Rica Koyanagi  PTA [facility]  Acute  Rehab Pager      [phone]

## 2015-05-07 NOTE — Clinical Social Work Placement (Signed)
   CLINICAL SOCIAL WORK PLACEMENT  NOTE  Date:  05/07/2015  Patient Details  Name: Stacey Hardin MRN: [id number] Date of Birth: 03-29-1932  Clinical Social Work is seeking post-discharge placement for this patient at the Skilled  Nursing Facility level of care (*CSW will initial, date and re-position this form in  chart as items are completed):  No   Patient/family provided with [place] Clinical Social Work Department's list of facilities offering this level of care within the geographic area requested by the patient (or if unable, by the patient's family).  Yes   Patient/family informed of their freedom to choose among providers that offer the needed level of care, that participate in Medicare, Medicaid or managed care program needed by the patient, have an available bed and are willing to accept the patient.  No   Patient/family informed of [facility]'s ownership interest in [facility] and [facility], as well as of the fact that they are under no obligation to receive care at these facilities.  PASRR submitted to EDS on 05/06/15     PASRR number received on 05/06/15     Existing PASRR number confirmed on       FL2 transmitted to all facilities in geographic area requested by pt/family on 05/06/15     FL2 transmitted to all facilities within larger geographic area on       Patient informed that his/her managed care company has contracts with or will negotiate with certain facilities, including the following:        Yes   Patient/family informed of bed offers received.  Patient chooses bed at Other - please specify in the comment section below: [facility])     Physician recommends and patient chooses bed at      Patient to be transferred to   on  .  Patient to be transferred to facility by       Patient family notified on   of transfer.  Name of family member notified:        PHYSICIAN Please sign FL2, Please prepare priority discharge summary,  including medications     Additional Comment:    _______________________________________________ Orson Eva, LCSW 05/07/2015, 10:50 AM

## 2015-05-07 NOTE — Plan of Care (Signed)
Problem: Phase II Progression Outcomes Goal: Tolerating diet Outcome: Not Progressing nausea

## 2015-05-07 NOTE — Progress Notes (Signed)
Patient ID: Naryiah Schley, female   DOB: 10/05/1931, 81 y.o.   MRN: [id number]  [facility] PROGRESS NOTE  Jerline Linzy ACZ:[id number] DOB: 1931-10-07 DOA: 05/07/2015 PCP: Colon Branch, MD   Brief narrative:    Pt is 81 yo female with no specific medical history who presented to [facility] ED after an episode of fall [place], she lost her balance while climbing the stairs and fell on her right side. She currently reports pain to be constant and sharp, right hip area, 7/10 in severity, non radiating, worse with movement and with no specific alleviating factors. Pt reports being unable to bear any weight. She denies fevers, chills, chest pain or shortness of breath, no similar events in the past.   In ED, pt noted to be hemodynamically stable, VSS, imaging studies notable for right hip femoral neck fracture. [facility] asked to admit for further evaluation and ortho team consulted.   Assessment/Plan:    Active Problems: Fall - appears to be mechanical - PT and OT eval done, recommend HH PT or SNF - SW consult for consideration of SNF  Fever up to 101.1 F - overnight 10/13 - will ask for UA and CXR to rule out possible developing infection - WBC is WNL so will hold off on ABX for now unless UA or CXR with sings of an infection   Right hip femoral neck fracture - s/p hip replacement, post op day #2 - appreciate assistance - provide analgesia as needed   Post op anemia  - persistent drop in Hg post op, 15 --> 11 --> 9.5 this AM - hold off on transfusion at this time - CBC In AM  Leukocytosis - post op related - resolved this AM   Hypotension - post op and with acute blood anemia - BP reasonably stable   Acute thrombocytopenia - stop Lovenox SQ as pt is on Aspirin BID per ortho team, no signs of bleeding for now - will monitor - CBC in AM  DVT prophylaxis - will keep on Aspirin BID   Code Status: Full.  Family Communication:  plan of  care discussed with the patient Disposition Plan: SNF vs Home   IV access:  Peripheral IV  Procedures and diagnostic studies:     Ct Head Wo Contrast 05-07-15 Negative for bleed or other acute intracranial process. 2. Atrophy and nonspecific white matter changes.   Dg Hip Unilat With Pelvis 2-3 Views Right 05-07-15 Impacted fracture of right femoral neck cannot be excluded.   Dg Femur 1v Right 07-May-2015 There is an acute impacted fracture of the junction of the right humeral head and neck. More distally no acute abnormality of the femur is demonstrated  Medical Consultants:  Ortho  Other Consultants:  PT   IAnti-Infectives:   None  Debbora Presto, MD  [facility] Pager [phone]  If 7PM-7AM, please contact night-coverage [email or web address] Password [email or web address] 05/07/2015, 9:04 AM   LOS: 2 days   HPI/Subjective: No events overnight.   Objective: Filed Vitals:   05/06/15 2113 05/06/15 2217 05/07/15 0100 05/07/15 0542  BP: 101/46   106/46  Pulse: 93   78  Temp: 101.1 F (38.4 C) 100.1 F (37.8 C) 98.4 F (36.9 C) 98.9 F (37.2 C)  TempSrc: Oral   Oral  Resp: 16   16  Height:      Weight:      SpO2: 100%   98%    Intake/Output Summary (Last 24 hours) at 05/07/15  0923 Last data filed at 05/07/15 0553  Gross per 24 hour  Intake 2846.25 ml  Output    900 ml  Net 1946.25 ml    Exam:   General:  Pt is alert, follows commands appropriately, not in acute distress  Cardiovascular: Regular rate and rhythm, no rubs, no gallops  Respiratory: Clear to auscultation bilaterally, no wheezing, no crackles, no rhonchi  Abdomen: Soft, non tender, non distended, bowel sounds present, no guarding  Data Reviewed: Basic Metabolic Panel:  Recent Labs Lab 05/05/15 1019 05/06/15 0435 05/07/15 0450  NA 139 138 137  K 4.6 4.1 3.7  CL 103 108 111  CO2 27 24 24   GLUCOSE 101* 192* 99  BUN 19 15 13   CREATININE 0.88 0.81 0.77  CALCIUM 9.7 7.5* 7.1*   CBC:  Recent  Labs Lab 05/05/15 1019 05/06/15 0435 05/07/15 0450  WBC 8.3 15.3* 10.4  NEUTROABS 6.5  --   --   HGB 15.9* 11.2* 9.5*  HCT 48.2* 33.8* 28.8*  MCV 94.7 94.7 95.0  PLT 258 214 134*   CBG:  Recent Labs Lab 05/05/15 1228  GLUCAP 111*    Recent Results (from the past 240 hour(s))  Surgical pcr screen     Status: None   Collection Time: 05/05/15  3:54 PM  Result Value Ref Range Status   MRSA, PCR NEGATIVE NEGATIVE Final   Staphylococcus aureus NEGATIVE NEGATIVE Final    Comment:        The Xpert SA Assay (FDA approved for NASAL specimens in patients over 41 years of age), is one component of a comprehensive surveillance program.  Test performance has been validated by [facility] for patients greater than or equal to 79 year old. It is not intended to diagnose infection nor to guide or monitor treatment.      Scheduled Meds: . aspirin EC  325 mg Oral BID PC  . docusate sodium  100 mg Oral BID  . enoxaparin (LOVENOX) injection  40 mg Subcutaneous Q24H  . feeding supplement (ENSURE ENLIVE)  237 mL Oral BID BM  . Influenza vac split quadrivalent PF  0.5 mL Intramuscular Tomorrow-1000  . pantoprazole (PROTONIX) IV  40 mg Intravenous Q24H   Continuous Infusions: . sodium chloride 75 mL/hr at 05/06/15 1801

## 2015-05-07 NOTE — Progress Notes (Signed)
Physical Therapy Treatment Patient Details Name: Stacey Hardin MRN: [id number] DOB: 08-04-1931 Today's Date: 05/07/2015    History of Present Illness Pt admitted after a fall and found to have a right hip fracture and a right radial neck fracture. Pt underwent R THA-DA 05/05/15 and conservative management of R radial neck fracture.    PT Comments    Assisted pt from recliner to amb to bathroom.  Assisted with amb a limited distance in hallway.  Great difficulty weight shift and advancing either LE.  Distance limited by pain level and nausea.  Assisted back to bed per pt request.  Follow Up Recommendations  SNF (pt progressing slowly.  Will need [facility])     Equipment Recommendations       Recommendations for Other Services       Precautions / Restrictions Precautions Precautions: Fall Restrictions RUE Weight Bearing: Non weight bearing RLE Weight Bearing: Weight bearing as tolerated Other Position/Activity Restrictions: Per MD, ok to weight bear R LE and R UE    Mobility  Bed Mobility Overal bed mobility: Needs Assistance Bed Mobility: Sit to Supine     Supine to sit: Mod assist;+2 for physical assistance;HOB elevated Sit to supine: Max assist;+2 for physical assistance;+2 for safety/equipment;HOB elevated   General bed mobility comments: assisted back to bed with increased time and positioned to comfort  Transfers Overall transfer level: Needs assistance Equipment used: Right platform walker;Rolling walker (2 wheeled) Transfers: Sit to/from Stand Sit to Stand: Mod assist;+2 physical assistance;+2 safety/equipment         General transfer comment: 25% VC's on proper tech and + 2 assist for safety.  Extra assist for stand to sit.  Ambulation/Gait Ambulation/Gait assistance: Min assist;+2 physical assistance;+2 safety/equipment Ambulation Distance (Feet): 22 Feet Assistive device: Right platform walker;Rolling walker (2 wheeled) Gait Pattern/deviations:  Step-to pattern;Decreased stance time - right     General Gait Details: amb from recliner to abthroom then a limited distance in hallway.  Unsteady gait.  Required increased time to weight shift and advance either LE.  Distance limited by pain and mild c/o nausea.  HIGH FALL Risk.   Stairs            Wheelchair Mobility    Modified Rankin (Stroke Patients Only)       Balance                                    Cognition Arousal/Alertness: Awake/alert Behavior During Therapy: WFL for tasks assessed/performed Overall Cognitive Status: Within Functional Limits for tasks assessed                      Exercises      General Comments        Pertinent Vitals/Pain Pain Assessment: 0-10 Pain Score: 8  Pain Location: R hip Pain Descriptors / Indicators: Aching;Sore Pain Intervention(s): Monitored during session;Repositioned    Home Living                      Prior Function            PT Goals (current goals can now be found in the care plan section) Progress towards PT goals: Progressing toward goals    Frequency  Min 5X/week    PT Plan      Co-evaluation             End  of Session Equipment Utilized During Treatment: Gait belt Activity Tolerance: Patient limited by fatigue;Patient limited by pain Patient left: in chair;with call bell/phone within reach     Time: 1325-1355 PT Time Calculation (min) (ACUTE ONLY): 30 min  Charges:  $Gait Training: 8-22 mins $Therapeutic Activity: 8-22 mins                    G Codes:      Felecia Shelling  PTA [facility]  Acute  Rehab Pager      [phone]

## 2015-05-07 NOTE — Discharge Instructions (Signed)
INSTRUCTIONS AFTER JOINT REPLACEMENT  ° °o Remove items at home which could result in a fall. This includes throw rugs or furniture in walking pathways °o ICE to the affected joint every three hours while awake for 30 minutes at a time, for at least the first 3-5 days, and then as needed for pain and swelling.  Continue to use ice for pain and swelling. You may notice swelling that will progress down to the foot and ankle.  This is normal after surgery.  Elevate your leg when you are not up walking on it.   °o Continue to use the breathing machine you got in the hospital (incentive spirometer) which will help keep your temperature down.  It is common for your temperature to cycle up and down following surgery, especially at night when you are not up moving around and exerting yourself.  The breathing machine keeps your lungs expanded and your temperature down. ° ° °DIET:  As you were doing prior to hospitalization, we recommend a well-balanced diet. ° °DRESSING / WOUND CARE / SHOWERING ° °Keep the surgical dressing until follow up.  The dressing is water proof, so you can shower without any extra covering.  IF THE DRESSING FALLS OFF or the wound gets wet inside, change the dressing with sterile gauze.  Please use good hand washing techniques before changing the dressing.  Do not use any lotions or creams on the incision until instructed by your surgeon.   ° °ACTIVITY ° °o Increase activity slowly as tolerated, but follow the weight bearing instructions below.   °o No driving for 6 weeks or until further direction given by your physician.  You cannot drive while taking narcotics.  °o No lifting or carrying greater than 10 lbs. until further directed by your surgeon. °o Avoid periods of inactivity such as sitting longer than an hour when not asleep. This helps prevent blood clots.  °o You may return to work once you are authorized by your doctor.  ° ° ° °WEIGHT BEARING  ° °Weight bearing as tolerated with assist  device (walker, cane, etc) as directed, use it as long as suggested by your surgeon or therapist, typically at least 4-6 weeks. ° ° °EXERCISES ° °Results after joint replacement surgery are often greatly improved when you follow the exercise, range of motion and muscle strengthening exercises prescribed by your doctor. Safety measures are also important to protect the joint from further injury. Any time any of these exercises cause you to have increased pain or swelling, decrease what you are doing until you are comfortable again and then slowly increase them. If you have problems or questions, call your caregiver or physical therapist for advice.  ° °Rehabilitation is important following a joint replacement. After just a few days of immobilization, the muscles of the leg can become weakened and shrink (atrophy).  These exercises are designed to build up the tone and strength of the thigh and leg muscles and to improve motion. Often times heat used for twenty to thirty minutes before working out will loosen up your tissues and help with improving the range of motion but do not use heat for the first two weeks following surgery (sometimes heat can increase post-operative swelling).  ° °These exercises can be done on a training (exercise) mat, on the floor, on a table or on a bed. Use whatever works the best and is most comfortable for you.    Use music or television while you are exercising so that   the exercises are a pleasant break in your day. This will make your life better with the exercises acting as a break in your routine that you can look forward to.   Perform all exercises about fifteen times, three times per day or as directed.  You should exercise both the operative leg and the other leg as well. ° °Exercises include: °  °• Quad Sets - Tighten up the muscle on the front of the thigh (Quad) and hold for 5-10 seconds.   °• Straight Leg Raises - With your knee straight (if you were given a brace, keep it on),  lift the leg to 60 degrees, hold for 3 seconds, and slowly lower the leg.  Perform this exercise against resistance later as your leg gets stronger.  °• Leg Slides: Lying on your back, slowly slide your foot toward your buttocks, bending your knee up off the floor (only go as far as is comfortable). Then slowly slide your foot back down until your leg is flat on the floor again.  °• Angel Wings: Lying on your back spread your legs to the side as far apart as you can without causing discomfort.  °• Hamstring Strength:  Lying on your back, push your heel against the floor with your leg straight by tightening up the muscles of your buttocks.  Repeat, but this time bend your knee to a comfortable angle, and push your heel against the floor.  You may put a pillow under the heel to make it more comfortable if necessary.  ° °A rehabilitation program following joint replacement surgery can speed recovery and prevent re-injury in the future due to weakened muscles. Contact your doctor or a physical therapist for more information on knee rehabilitation.  ° ° °CONSTIPATION ° °Constipation is defined medically as fewer than three stools per week and severe constipation as less than one stool per week.  Even if you have a regular bowel pattern at home, your normal regimen is likely to be disrupted due to multiple reasons following surgery.  Combination of anesthesia, postoperative narcotics, change in appetite and fluid intake all can affect your bowels.  ° °YOU MUST use at least one of the following options; they are listed in order of increasing strength to get the job done.  They are all available over the counter, and you may need to use some, POSSIBLY even all of these options:   ° °Drink plenty of fluids (prune juice may be helpful) and high fiber foods °Colace 100 mg by mouth twice a day  °Senokot for constipation as directed and as needed Dulcolax (bisacodyl), take with full glass of water  °Miralax (polyethylene glycol)  once or twice a day as needed. ° °If you have tried all these things and are unable to have a bowel movement in the first 3-4 days after surgery call either your surgeon or your primary doctor.   ° °If you experience loose stools or diarrhea, hold the medications until you stool forms back up.  If your symptoms do not get better within 1 week or if they get worse, check with your doctor.  If you experience "the worst abdominal pain ever" or develop nausea or vomiting, please contact the office immediately for further recommendations for treatment. ° ° °ITCHING:  If you experience itching with your medications, try taking only a single pain pill, or even half a pain pill at a time.  You can also use Benadryl over the counter for itching or also to   help with sleep.  ° °TED HOSE STOCKINGS:  Use stockings on both legs until for at least 2 weeks or as directed by physician office. They may be removed at night for sleeping. ° °MEDICATIONS:  See your medication summary on the “After Visit Summary” that nursing will review with you.  You may have some home medications which will be placed on hold until you complete the course of blood thinner medication.  It is important for you to complete the blood thinner medication as prescribed. ° °PRECAUTIONS:  If you experience chest pain or shortness of breath - call 911 immediately for transfer to the hospital emergency department.  ° °If you develop a fever greater that 101 F, purulent drainage from wound, increased redness or drainage from wound, foul odor from the wound/dressing, or calf pain - CONTACT YOUR SURGEON.   °                                                °FOLLOW-UP APPOINTMENTS:  If you do not already have a post-op appointment, please call the office for an appointment to be seen by your surgeon.  Guidelines for how soon to be seen are listed in your “After Visit Summary”, but are typically between 1-4 weeks after surgery. ° °OTHER INSTRUCTIONS:  ° °Knee  Replacement:  Do not place pillow under knee, focus on keeping the knee straight while resting. CPM instructions: 0-90 degrees, 2 hours in the morning, 2 hours in the afternoon, and 2 hours in the evening. Place foam block, curve side up under heel at all times except when in CPM or when walking.  DO NOT modify, tear, cut, or change the foam block in any way. ° °MAKE SURE YOU:  °• Understand these instructions.  °• Get help right away if you are not doing well or get worse.  ° ° °Thank you for letting us be a part of your medical care team.  It is a privilege we respect greatly.  We hope these instructions will help you stay on track for a fast and full recovery!  °INSTRUCTIONS AFTER JOINT REPLACEMENT  ° °o Remove items at home which could result in a fall. This includes throw rugs or furniture in walking pathways °o ICE to the affected joint every three hours while awake for 30 minutes at a time, for at least the first 3-5 days, and then as needed for pain and swelling.  Continue to use ice for pain and swelling. You may notice swelling that will progress down to the foot and ankle.  This is normal after surgery.  Elevate your leg when you are not up walking on it.   °o Continue to use the breathing machine you got in the hospital (incentive spirometer) which will help keep your temperature down.  It is common for your temperature to cycle up and down following surgery, especially at night when you are not up moving around and exerting yourself.  The breathing machine keeps your lungs expanded and your temperature down. ° ° °DIET:  As you were doing prior to hospitalization, we recommend a well-balanced diet. ° °DRESSING / WOUND CARE / SHOWERING ° °Keep the surgical dressing until follow up.  The dressing is water proof, so you can shower without any extra covering.  IF THE DRESSING FALLS OFF or the wound gets wet inside,   change the dressing with sterile gauze.  Please use good hand washing techniques before  changing the dressing.  Do not use any lotions or creams on the incision until instructed by your surgeon.    ACTIVITY  o Increase activity slowly as tolerated, but follow the weight bearing instructions below.   o No driving for 6 weeks or until further direction given by your physician.  You cannot drive while taking narcotics.  o No lifting or carrying greater than 10 lbs. until further directed by your surgeon. o Avoid periods of inactivity such as sitting longer than an hour when not asleep. This helps prevent blood clots.  o You may return to work once you are authorized by your doctor.     WEIGHT BEARING   Weight bearing as tolerated with assist device (walker, cane, etc) as directed, use it as long as suggested by your surgeon or therapist, typically at least 4-6 weeks.   EXERCISES  Results after joint replacement surgery are often greatly improved when you follow the exercise, range of motion and muscle strengthening exercises prescribed by your doctor. Safety measures are also important to protect the joint from further injury. Any time any of these exercises cause you to have increased pain or swelling, decrease what you are doing until you are comfortable again and then slowly increase them. If you have problems or questions, call your caregiver or physical therapist for advice.   Rehabilitation is important following a joint replacement. After just a few days of immobilization, the muscles of the leg can become weakened and shrink (atrophy).  These exercises are designed to build up the tone and strength of the thigh and leg muscles and to improve motion. Often times heat used for twenty to thirty minutes before working out will loosen up your tissues and help with improving the range of motion but do not use heat for the first two weeks following surgery (sometimes heat can increase post-operative swelling).   These exercises can be done on a training (exercise) mat, on the  floor, on a table or on a bed. Use whatever works the best and is most comfortable for you.    Use music or television while you are exercising so that the exercises are a pleasant break in your day. This will make your life better with the exercises acting as a break in your routine that you can look forward to.   Perform all exercises about fifteen times, three times per day or as directed.  You should exercise both the operative leg and the other leg as well.  Exercises include:    Quad Sets - Tighten up the muscle on the front of the thigh (Quad) and hold for 5-10 seconds.    Straight Leg Raises - With your knee straight (if you were given a brace, keep it on), lift the leg to 60 degrees, hold for 3 seconds, and slowly lower the leg.  Perform this exercise against resistance later as your leg gets stronger.   Leg Slides: Lying on your back, slowly slide your foot toward your buttocks, bending your knee up off the floor (only go as far as is comfortable). Then slowly slide your foot back down until your leg is flat on the floor again.   Angel Wings: Lying on your back spread your legs to the side as far apart as you can without causing discomfort.   Hamstring Strength:  Lying on your back, push your heel against the floor with  your leg straight by tightening up the muscles of your buttocks.  Repeat, but this time bend your knee to a comfortable angle, and push your heel against the floor.  You may put a pillow under the heel to make it more comfortable if necessary.   A rehabilitation program following joint replacement surgery can speed recovery and prevent re-injury in the future due to weakened muscles. Contact your doctor or a physical therapist for more information on knee rehabilitation.    CONSTIPATION  Constipation is defined medically as fewer than three stools per week and severe constipation as less than one stool per week.  Even if you have a regular bowel pattern at home, your  normal regimen is likely to be disrupted due to multiple reasons following surgery.  Combination of anesthesia, postoperative narcotics, change in appetite and fluid intake all can affect your bowels.   YOU MUST use at least one of the following options; they are listed in order of increasing strength to get the job done.  They are all available over the counter, and you may need to use some, POSSIBLY even all of these options:    Drink plenty of fluids (prune juice may be helpful) and high fiber foods Colace 100 mg by mouth twice a day  Senokot for constipation as directed and as needed Dulcolax (bisacodyl), take with full glass of water  Miralax (polyethylene glycol) once or twice a day as needed.  If you have tried all these things and are unable to have a bowel movement in the first 3-4 days after surgery call either your surgeon or your primary doctor.    If you experience loose stools or diarrhea, hold the medications until you stool forms back up.  If your symptoms do not get better within 1 week or if they get worse, check with your doctor.  If you experience "the worst abdominal pain ever" or develop nausea or vomiting, please contact the office immediately for further recommendations for treatment.   ITCHING:  If you experience itching with your medications, try taking only a single pain pill, or even half a pain pill at a time.  You can also use Benadryl over the counter for itching or also to help with sleep.   TED HOSE STOCKINGS:  Use stockings on both legs until for at least 2 weeks or as directed by physician office. They may be removed at night for sleeping.  MEDICATIONS:  See your medication summary on the After Visit Summary that nursing will review with you.  You may have some home medications which will be placed on hold until you complete the course of blood thinner medication.  It is important for you to complete the blood thinner medication as prescribed.  PRECAUTIONS:   If you experience chest pain or shortness of breath - call 911 immediately for transfer to the hospital emergency department.   If you develop a fever greater that 101 F, purulent drainage from wound, increased redness or drainage from wound, foul odor from the wound/dressing, or calf pain - CONTACT YOUR SURGEON.                                                   FOLLOW-UP APPOINTMENTS:  If you do not already have a post-op appointment, please call the office for an appointment to be seen  by your surgeon.  Guidelines for how soon to be seen are listed in your After Visit Summary, but are typically between 1-4 weeks after surgery.  OTHER INSTRUCTIONS:   Knee Replacement:  Do not place pillow under knee, focus on keeping the knee straight while resting. CPM instructions: 0-90 degrees, 2 hours in the morning, 2 hours in the afternoon, and 2 hours in the evening. Place foam block, curve side up under heel at all times except when in CPM or when walking.  DO NOT modify, tear, cut, or change the foam block in any way.  MAKE SURE YOU:   Understand these instructions.   Get help right away if you are not doing well or get worse.    Thank you for letting us be a part of your medical care team.  It is a privilege we respect greatly.  We hope these instructions will help you stay on track for a fast and full recovery!

## 2015-05-08 DIAGNOSIS — Z96649 Presence of unspecified artificial hip joint: Secondary | ICD-10-CM

## 2015-05-08 DIAGNOSIS — S5010XA Contusion of unspecified forearm, initial encounter: Secondary | ICD-10-CM | POA: Insufficient documentation

## 2015-05-08 DIAGNOSIS — S72001A Fracture of unspecified part of neck of right femur, initial encounter for closed fracture: Secondary | ICD-10-CM | POA: Insufficient documentation

## 2015-05-08 DIAGNOSIS — S5011XA Contusion of right forearm, initial encounter: Secondary | ICD-10-CM

## 2015-05-08 DIAGNOSIS — R509 Fever, unspecified: Secondary | ICD-10-CM

## 2015-05-08 DIAGNOSIS — Z96641 Presence of right artificial hip joint: Secondary | ICD-10-CM | POA: Insufficient documentation

## 2015-05-08 DIAGNOSIS — W19XXXA Unspecified fall, initial encounter: Secondary | ICD-10-CM

## 2015-05-08 LAB — URINE CULTURE: CULTURE: NO GROWTH

## 2015-05-08 LAB — BASIC METABOLIC PANEL
Anion gap: 5 (ref 5–15)
BUN: 9 mg/dL (ref 6–20)
CALCIUM: 7.3 mg/dL — AB (ref 8.9–10.3)
CO2: 25 mmol/L (ref 22–32)
Chloride: 108 mmol/L (ref 101–111)
Creatinine, Ser: 0.6 mg/dL (ref 0.44–1.00)
GFR calc Af Amer: >60 mL/min (ref >60)
GLUCOSE: 104 mg/dL — AB (ref 65–99)
POTASSIUM: 3.2 mmol/L — AB (ref 3.5–5.1)
SODIUM: 138 mmol/L (ref 135–145)

## 2015-05-08 LAB — CBC
HCT: 27.6 % — ABNORMAL LOW (ref 36.0–46.0)
Hemoglobin: 9.1 g/dL — ABNORMAL LOW (ref 12.0–15.0)
MCH: 30.6 pg (ref 26.0–34.0)
MCHC: 33 g/dL (ref 30.0–36.0)
MCV: 92.9 fL (ref 78.0–100.0)
PLATELETS: 155 10*3/uL (ref 150–400)
RBC: 2.97 MIL/uL — AB (ref 3.87–5.11)
RDW: 13.1 % (ref 11.5–15.5)
WBC: 10.9 10*3/uL — AB (ref 4.0–10.5)

## 2015-05-08 MED ORDER — DOCUSATE SODIUM 100 MG PO CAPS: 100.0000 mg | ORAL_CAPSULE | Freq: Two times a day (BID) | ORAL | Status: AC

## 2015-05-08 MED ORDER — PANTOPRAZOLE SODIUM 40 MG PO TBEC
40.0000 mg | DELAYED_RELEASE_TABLET | Freq: Every day | ORAL | Status: DC
  Administered 2015-05-08: 40 mg via ORAL
  Filled 2015-05-08: qty 1

## 2015-05-08 MED ORDER — POTASSIUM CHLORIDE CRYS ER 20 MEQ PO TBCR
40.0000 meq | EXTENDED_RELEASE_TABLET | Freq: Once | ORAL | Status: AC
  Administered 2015-05-08: 40 meq via ORAL
  Filled 2015-05-08: qty 2

## 2015-05-08 NOTE — Care Management Note (Signed)
Case Management Note  Patient Details  Name: Stacey Hardin MRN: [id number] Date of Birth: 02-Apr-1932  Subjective/Objective:     Fall, hip fracture               Action/Plan: Planned dc to SNF. CSW following for SNF placement.   Expected Discharge Date:                  Expected Discharge Plan:  Skilled Nursing Facility  In-House Referral:  Clinical Social Work, PCP / Management consultant  Discharge planning Services  NA, CM Consult   Status of Service:  Completed, signed off  Medicare Important Message Given:  Yes-second notification given Date Medicare IM Given:    Medicare IM give by:    Date Additional Medicare IM Given:    Additional Medicare Important Message give by:     If discussed at Long Length of Stay Meetings, dates discussed:    Additional Comments:  Elliot Cousin, RN 05/08/2015, 9:52 AM

## 2015-05-08 NOTE — Clinical Social Work Placement (Signed)
   CLINICAL SOCIAL WORK PLACEMENT  NOTE  Date:  05/08/2015  Patient Details  Name: Stacey Hardin MRN: [id number] Date of Birth: [birth year]  Clinical Social Work is seeking post-discharge placement for this patient at the Skilled  Nursing Facility level of care (*CSW will initial, date and re-position this form in  chart as items are completed):  No   Patient/family provided with [facility] Social Work Department's list of facilities offering this level of care within the geographic area requested by the patient (or if unable, by the patient's family).  Yes   Patient/family informed of their freedom to choose among providers that offer the needed level of care, that participate in Medicare, Medicaid or managed care program needed by the patient, have an available bed and are willing to accept the patient.  No   Patient/family informed of [facility]'s ownership interest in [facility] and [facility], as well as of the fact that they are under no obligation to receive care at these facilities.  PASRR submitted to EDS on 05/06/15     PASRR number received on 05/06/15     Existing PASRR number confirmed on       FL2 transmitted to all facilities in geographic area requested by pt/family on 05/06/15     FL2 transmitted to all facilities within larger geographic area on       Patient informed that his/her managed care company has contracts with or will negotiate with certain facilities, including the following:        Yes   Patient/family informed of bed offers received.  Patient chooses bed at Other - please specify in the comment section below: [facility])     Physician recommends and patient chooses bed at      Patient to be transferred to  [facility] on  .May 08, 2015  Patient to be transferred to facility by     family  Patient family notified on  May 08, 2015 of transfer.  Name of family member notified:   Stacey Hardin Daughter  at bedside     PHYSICIAN Please sign FL2, Please prepare priority discharge summary, including medications     Additional Comment:    _______________________________________________ Annetta Maw, LCSW 05/08/2015, 11:27 AM

## 2015-05-08 NOTE — Progress Notes (Signed)
Physical Therapy Treatment Patient Details Name: Stacey Hardin MRN: [id number] DOB: 13-Oct-1931 Today's Date: 05/08/2015    History of Present Illness Pt admitted after a fall and found to have a right hip fracture and a right radial neck fracture. Pt underwent R THA-DA 05/05/15 and conservative management of R radial neck fracture.    PT Comments    Pt progressing well, continue to recommend STSNF post acute  Follow Up Recommendations  SNF     Equipment Recommendations  None recommended by PT    Recommendations for Other Services       Precautions / Restrictions Precautions Precautions: Fall Restrictions RUE Weight Bearing: Non weight bearing (platform ok per Dr. Magnus Ivan ) RLE Weight Bearing: Weight bearing as tolerated    Mobility  Bed Mobility Overal bed mobility: Needs Assistance Bed Mobility: Sit to Supine     Supine to sit: Mod assist;HOB elevated     General bed mobility comments: assist for lateral scooting and to bring trunk forward, cues for use of LLE to assist self  Transfers Overall transfer level: Needs assistance Equipment used: Right platform walker;Rolling walker (2 wheeled) Transfers: Sit to/from Stand Sit to Stand: Min assist         General transfer comment: VCs for hand placement and wt shift  Ambulation/Gait Ambulation/Gait assistance: Min guard;Min assist Ambulation Distance (Feet): 50 Feet (15' more) Assistive device: Right platform walker;Rolling walker (2 wheeled) Gait Pattern/deviations: Step-to pattern;Decreased step length - right;Decreased step length - left;Antalgic     General Gait Details: cues for RW position during turns, assist for maneuvering occasionally   Stairs            Wheelchair Mobility    Modified Rankin (Stroke Patients Only)       Balance Overall balance assessment: Needs assistance Sitting-balance support: Feet supported;No upper extremity supported Sitting balance-Leahy Scale: Fair      Standing balance support: During functional activity;Single extremity supported Standing balance-Leahy Scale: Poor                      Cognition Arousal/Alertness: Awake/alert Behavior During Therapy: WFL for tasks assessed/performed Overall Cognitive Status: Within Functional Limits for tasks assessed                      Exercises General Exercises - Lower Extremity Ankle Circles/Pumps: AROM;Both;10 reps Heel Slides: AAROM;Right;10 reps    General Comments        Pertinent Vitals/Pain Pain Assessment: 0-10 Pain Score: 2  Pain Location: R hip Pain Descriptors / Indicators: Sore Pain Intervention(s): Limited activity within patient's tolerance;Monitored during session;Premedicated before session;Repositioned;Ice applied    Home Living                      Prior Function            PT Goals (current goals can now be found in the care plan section) Acute Rehab PT Goals Patient Stated Goal: to return to more independence.  PT Goal Formulation: With patient/family Time For Goal Achievement: 05/20/15 Potential to Achieve Goals: Good Progress towards PT goals: Progressing toward goals    Frequency  Min 5X/week    PT Plan Current plan remains appropriate    Co-evaluation             End of Session Equipment Utilized During Treatment: Gait belt Activity Tolerance: Patient tolerated treatment well Patient left: in chair;with call bell/phone within reach;with family/visitor present  Time: 4827-0786 PT Time Calculation (min) (ACUTE ONLY): 26 min  Charges:  $Gait Training: 23-37 mins                    G Codes:      Stacey Hardin 2015/06/06, 10:22 AM

## 2015-05-08 NOTE — Progress Notes (Signed)
Report called to sheri at [facility] [place]  [employer]

## 2015-05-08 NOTE — Clinical Social Work Note (Signed)
CSW reviewed pt chart that reflected discharge order.  CSW texted MD requesting DC summary for pt to be able to be discharged to her SNF bed.  Family is transporting to [facility] in [place]   .Elray Buba, LCSW [facility] Clinical Social Worker - Weekend Coverage cell #: [phone]

## 2015-05-08 NOTE — Discharge Summary (Signed)
Physician Discharge Summary  Stacey Hardin QBH:[id number] DOB: 01-02-1932 DOA: 05/05/2015  PCP: Colon Branch, MD  Admit date: 05/05/2015 Discharge date: 05/08/2015  Recommendations for Outpatient Follow-up:  1. Pt will need to follow up with PCP in 2-3 weeks post discharge 2. Please obtain BMP to evaluate electrolytes and kidney function   Discharge Diagnoses:  Principal Problem:   Fall (on) (from) other stairs and steps, initial encounter Active Problems:   Hip fracture (HCC)   Anemia due to acute blood loss, post op   Leukocytosis   Hypotension   Thrombocytopenia (HCC)  Discharge Condition: Stable  Diet recommendation: Heart healthy diet discussed in details    Brief narrative:    Pt is 81 yo female with no specific medical history who presented to [facility] ED after an episode of [place], she lost her balance while climbing the stairs and fell on her right side. She currently reports pain to be constant and sharp, right hip area, 7/10 in severity, non radiating, worse with movement and with no specific alleviating factors. Pt reports being unable to bear any weight. She denies fevers, chills, chest pain or shortness of breath, no similar events in the past.   In ED, pt noted to be hemodynamically stable, VSS, imaging studies notable for right hip femoral neck fracture. [facility] asked to admit for further evaluation and ortho team consulted.   Assessment/Plan:    Active Problems: Fall - appears to be mechanical - PT and OT eval done, recommended SNF - ready for d/c to SNF   Fever up to 101.1 F - overnight 10/13 - UA and CXR negative for an infections etiologies   Right hip femoral neck fracture - s/p hip replacement, post op day #3 - appreciate assistance - provide analgesia as needed   Post op anemia  - persistent drop in Hg post op, 15 --> 11 --> 9.1 this AM - no need for transfusion   Leukocytosis - post op  related  Hypotension - post op and with acute blood anemia - BP reasonably stable   Acute thrombocytopenia - Aspirin BID per ortho team, no signs of bleeding for now  DVT prophylaxis - will keep on Aspirin BID   Code Status: Full.  Family Communication: plan of care discussed with the patient Disposition Plan: SNF  IV access:  Peripheral IV  Procedures and diagnostic studies:    Ct Head Wo Contrast 05/05/2015 Negative for bleed or other acute intracranial process. 2. Atrophy and nonspecific white matter changes.   Dg Hip Unilat With Pelvis 2-3 Views Right 05/05/2015 Impacted fracture of right femoral neck cannot be excluded.   Dg Femur 1v Right 05/05/2015 There is an acute impacted fracture of the junction of the right humeral head and neck. More distally no acute abnormality of the femur is demonstrated  Medical Consultants:  Ortho  Other Consultants:  PT   IAnti-Infectives:   None      Discharge Exam: Filed Vitals:   05/08/15 0432  BP: 103/79  Pulse: 76  Temp: 98.7 F (37.1 C)  Resp: 16   Filed Vitals:   05/07/15 0542 05/07/15 1457 05/07/15 2143 05/08/15 0432  BP: 106/46 111/57 120/62 103/79  Pulse: 78 78 91 76  Temp: 98.9 F (37.2 C) 98.2 F (36.8 C) 98.3 F (36.8 C) 98.7 F (37.1 C)  TempSrc: Oral Oral Oral Oral  Resp: 16 16 16 16   Height:      Weight:  SpO2: 98% 100% 99% 98%    General: Pt is alert, follows commands appropriately, not in acute distress Cardiovascular: Regular rate and rhythm, no rubs, no gallops Respiratory: Clear to auscultation bilaterally, no wheezing, no crackles, no rhonchi Abdominal: Soft, non tender, non distended, bowel sounds +, no guarding  Discharge Instructions  Discharge Instructions    Diet - low sodium heart healthy    Complete by:  As directed      Increase activity slowly    Complete by:  As directed             Medication List    TAKE these medications        aspirin  325 MG EC tablet  Take 1 tablet (325 mg total) by mouth 2 (two) times daily after a meal.     CALCIUM PO  Take 1 tablet by mouth daily.     docusate sodium 100 MG capsule  Commonly known as:  COLACE  Take 1 capsule (100 mg total) by mouth 2 (two) times daily.     multivitamin with minerals Tabs tablet  Take 1 tablet by mouth daily.     oxyCODONE-acetaminophen 5-325 MG tablet  Commonly known as:  PERCOCET/ROXICET  Take 1-2 tablets by mouth every 4 (four) hours as needed for moderate pain or severe pain.           Follow-up Information    Follow up with Kathryne Hitch, MD. Schedule an appointment as soon as possible for a visit in 2 weeks.   Specialty:  Orthopedic Surgery   Contact information:   [address] [place] [address] [phone]       Follow up with Kathryne Hitch, MD. Call in 2 weeks.   Specialty:  Orthopedic Surgery   Contact information:   [address] [place] [address] [phone]       Follow up with Colon Branch, MD.   Specialty:  Internal Medicine   Contact information:   [address] [place] [address] [phone]       Call Debbora Presto, MD.   Specialty:  Internal Medicine   Why:  As needed call my cell [phone]   Contact information:   [address] [place] [address] [phone]        The results of significant diagnostics from this hospitalization (including imaging, microbiology, ancillary and laboratory) are listed below for reference.    Microbiology: Recent Results (from the past 240 hour(s))  Surgical pcr screen     Status: None   Collection Time: 05/05/15  3:54 PM  Result Value Ref Range Status   MRSA, PCR NEGATIVE NEGATIVE Final   Staphylococcus aureus NEGATIVE NEGATIVE Final  Culture, Urine     Status: None   Collection Time: 05/07/15 10:23 AM  Result Value Ref Range Status   Specimen Description URINE, CLEAN CATCH  Final    Special Requests NONE  Final   Culture   Final    NO GROWTH 1 DAY Performed at [facility]    Report Status 05/08/2015 FINAL  Final    Labs: Basic Metabolic Panel:  Recent Labs Lab 05/05/15 1019 05/06/15 0435 05/07/15 0450 05/08/15 0415  NA 139 138 137 138  K 4.6 4.1 3.7 3.2*  CL 103 108 111 108  CO2 27 24 24 25   GLUCOSE 101* 192* 99 104*  BUN 19 15 13 9   CREATININE 0.88 0.81 0.77 0.60  CALCIUM 9.7 7.5* 7.1* 7.3*  CBC:  Recent Labs Lab 05/05/15 1019 05/06/15 0435 05/07/15 0450 05/08/15 0415  WBC 8.3 15.3* 10.4 10.9*  NEUTROABS 6.5  --   --   --   HGB 15.9* 11.2* 9.5* 9.1*  HCT 48.2* 33.8* 28.8* 27.6*  MCV 94.7 94.7 95.0 92.9  PLT 258 214 134* 155   CBG:  Recent Labs Lab 05/05/15 1228  GLUCAP 111*     SIGNED: Time coordinating discharge: 30 minutes  MAGICK-Lodie Waheed, MD  [facility] 05/08/2015, 8:52 AM Pager [phone]  If 7PM-7AM, please contact night-coverage [email or web address] Password [email or web address]

## 2016-03-11 ENCOUNTER — Encounter (INDEPENDENT_AMBULATORY_CARE_PROVIDER_SITE_OTHER): Payer: Self-pay

## 2016-04-02 IMAGING — DX DG PORTABLE PELVIS
1 series · 1 of 1 positions shown · non-contrast
Comparison: None.

CLINICAL DATA: Status post right total hip arthroplasty

EXAM:
PORTABLE PELVIS 1-2 VIEWS

[pelvis ap]
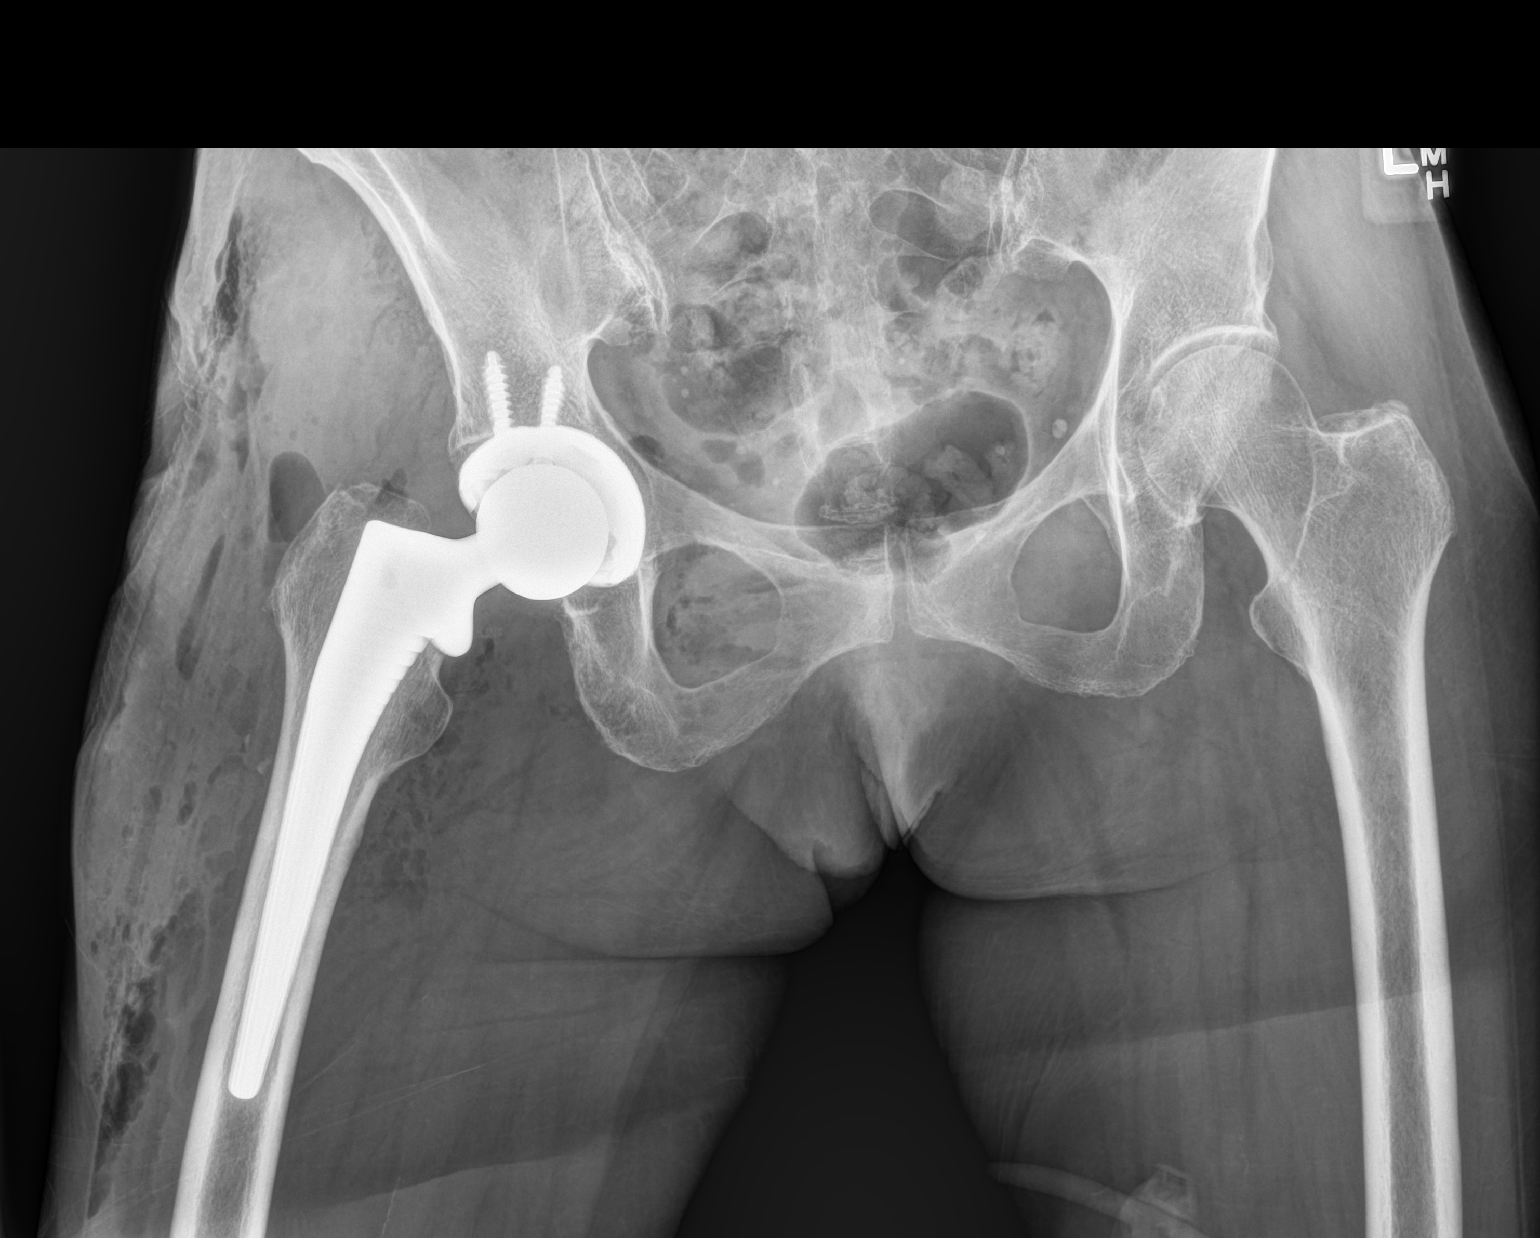

[1 of 1 positions shown; findings below may reference images not displayed]

FINDINGS: Frontal view obtained. There is a total hip prosthesis on the right
with prosthetic components appearing well-seated. No acute fracture
or dislocation. There is slight narrowing of the left hip joint.
Extensive soft tissue air on the right is consistent with the recent
surgery.
IMPRESSION: Right total hip prosthetic components appear well seated. No acute
fracture or dislocation. Soft tissue air is an expected
postoperative finding on the right. Slight narrowing of the left hip
joint is noted.

## 2016-04-02 IMAGING — RF DG HIP (WITH PELVIS) OPERATIVE*R*
1 series · 2 of 2 positions shown · non-contrast
Comparison: 05/05/2015

CLINICAL DATA: Right hip arthroplasty.

EXAM:
OPERATIVE RIGHT HIP (WITH PELVIS IF PERFORMED) 2 VIEWS
TECHNIQUE: Fluoroscopic spot image(s) were submitted for interpretation
post-operatively.

[Series 1: run · 2 of 2 slices shown]
[im 1/2]
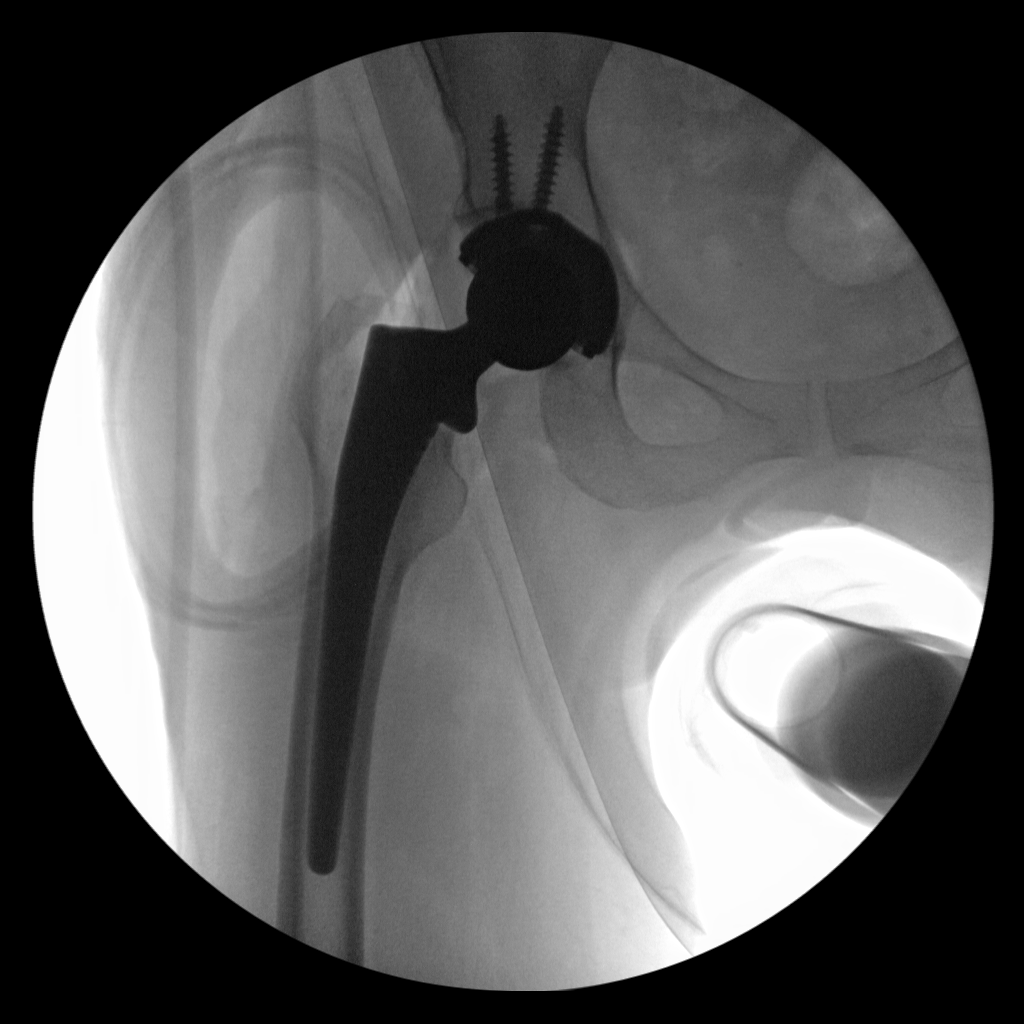
[im 2/2]
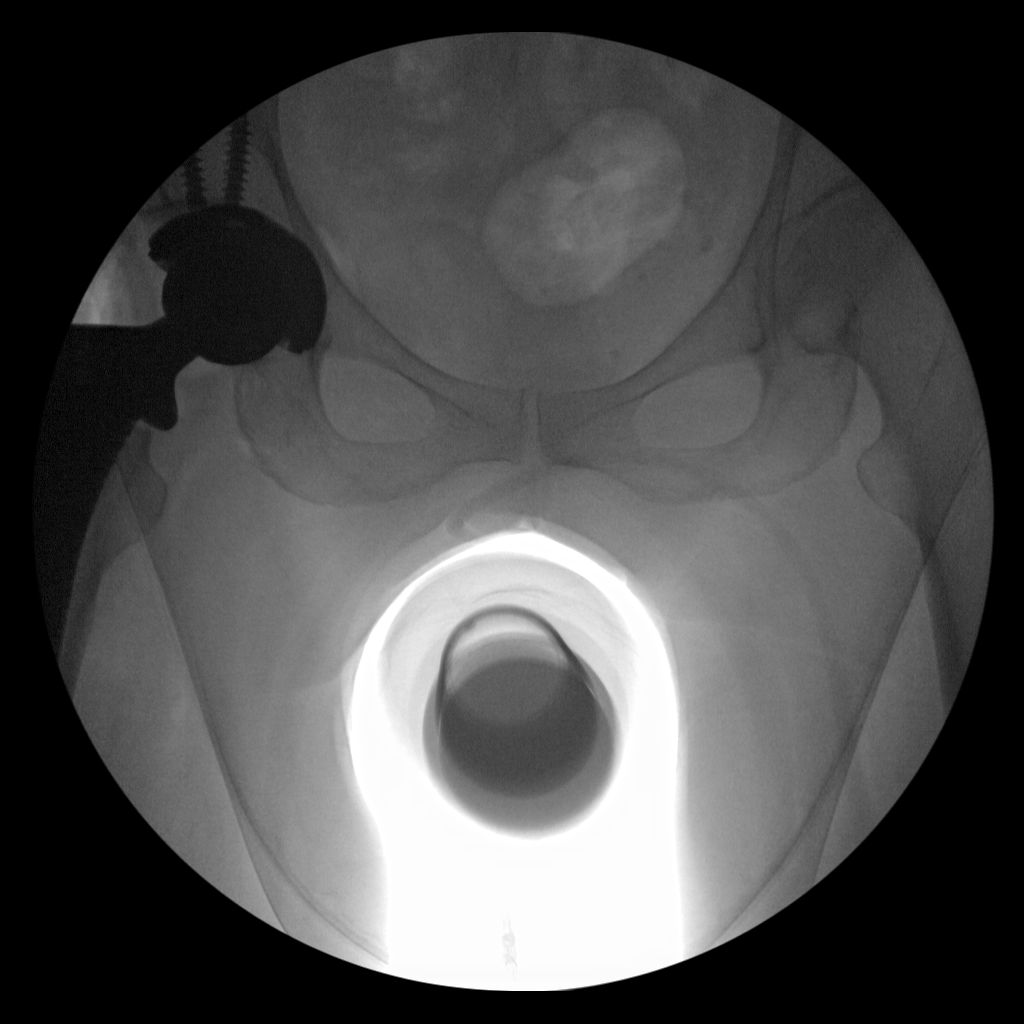

[2 of 2 positions shown; findings below may reference images not displayed]

FINDINGS: Changes of right hip replacement. Normal AP alignment. No hardware
or bony complicating feature visualized.
IMPRESSION: Right hip replacement without visualized complicating feature.

## 2016-05-15 ENCOUNTER — Ambulatory Visit (INDEPENDENT_AMBULATORY_CARE_PROVIDER_SITE_OTHER): Payer: Self-pay | Admitting: Orthopaedic Surgery

## 2016-05-29 ENCOUNTER — Ambulatory Visit (INDEPENDENT_AMBULATORY_CARE_PROVIDER_SITE_OTHER): Payer: Self-pay | Admitting: Orthopaedic Surgery

## 2016-06-21 ENCOUNTER — Ambulatory Visit (INDEPENDENT_AMBULATORY_CARE_PROVIDER_SITE_OTHER): Payer: Medicare Other | Admitting: Orthopaedic Surgery

## 2016-06-21 DIAGNOSIS — Z96641 Presence of right artificial hip joint: Secondary | ICD-10-CM | POA: Diagnosis not present

## 2016-06-21 NOTE — Progress Notes (Signed)
The patient is a 82-year-old is here at 14 months status post a right total hip arthroplasty direct anterior approach. This was to treat a right displaced femoral neck fracture. She is and living without any type of assistive device. She does have occasional aches and pains but nothing severe. She does not need pain medication either she states. She is really having no issues. She does have problems going up and downstairs and she is in a two-story house.  On examination of her right hip shows fluid internal rotation rotation flexion-extension actively and passively she is a little bit of pain in the groin and is a little bit over trochanteric area but this is minimal. She otherwise healthy-appearing. She is oriented 3 as well. Of note she denies any headache, shortness of breath, chest pain, fever, chills, nausea, vomiting.  This point she'll continue increase activities as she tolerates. I can follow her up on as-needed basis. I can always see her for anything else as well. If her right hip does start worsening in terms of any pain at any time, she would need to come back and see [place] for x-rays.

## 2016-10-09 ENCOUNTER — Telehealth (INDEPENDENT_AMBULATORY_CARE_PROVIDER_SITE_OTHER): Payer: Self-pay | Admitting: Orthopaedic Surgery

## 2016-10-09 NOTE — Telephone Encounter (Signed)
Patient called asking for a note reflecting that she had hip replacement surgery.  She had THA 2016. I mailed her [employer] Hip replacement ID card

## 2022-08-24 DEATH — deceased
